# Patient Record
Sex: Male | Born: 1960 | Race: White | Hispanic: No | Marital: Single | Smoking: Never smoker
Health system: Southern US, Community
[De-identification: ages and names within clinical notes are randomized; demographics above are authoritative.]

## PROBLEM LIST (undated history)

## (undated) VITALS — BP 106/51 | HR 72 | Temp 97.8°F | Resp 18 | Ht 65.0 in | Wt 340.0 lb

## (undated) DIAGNOSIS — F319 Bipolar disorder, unspecified: Secondary | ICD-10-CM

## (undated) DIAGNOSIS — M1990 Unspecified osteoarthritis, unspecified site: Secondary | ICD-10-CM

## (undated) DIAGNOSIS — F32A Depression, unspecified: Secondary | ICD-10-CM

## (undated) DIAGNOSIS — J449 Chronic obstructive pulmonary disease, unspecified: Secondary | ICD-10-CM

## (undated) DIAGNOSIS — R569 Unspecified convulsions: Secondary | ICD-10-CM

## (undated) DIAGNOSIS — F209 Schizophrenia, unspecified: Secondary | ICD-10-CM

## (undated) DIAGNOSIS — G4730 Sleep apnea, unspecified: Secondary | ICD-10-CM

## (undated) DIAGNOSIS — F329 Major depressive disorder, single episode, unspecified: Secondary | ICD-10-CM

---

## 2015-08-03 ENCOUNTER — Encounter (HOSPITAL_COMMUNITY): Payer: Self-pay | Admitting: *Deleted

## 2015-08-03 ENCOUNTER — Emergency Department (HOSPITAL_COMMUNITY)
Admission: EM | Admit: 2015-08-03 | Discharge: 2015-08-04 | Disposition: A | Discharge status: Other Acute Inpatient Hospital | Payer: Medicare Other | Attending: Emergency Medicine | Admitting: Emergency Medicine

## 2015-08-03 ENCOUNTER — Encounter (HOSPITAL_COMMUNITY): Payer: Self-pay | Admitting: Emergency Medicine

## 2015-08-03 DIAGNOSIS — F319 Bipolar disorder, unspecified: Secondary | ICD-10-CM | POA: Diagnosis present

## 2015-08-03 DIAGNOSIS — Z7951 Long term (current) use of inhaled steroids: Secondary | ICD-10-CM | POA: Insufficient documentation

## 2015-08-03 DIAGNOSIS — F209 Schizophrenia, unspecified: Secondary | ICD-10-CM | POA: Diagnosis not present

## 2015-08-03 DIAGNOSIS — J449 Chronic obstructive pulmonary disease, unspecified: Secondary | ICD-10-CM | POA: Diagnosis present

## 2015-08-03 DIAGNOSIS — G4700 Insomnia, unspecified: Secondary | ICD-10-CM | POA: Diagnosis present

## 2015-08-03 DIAGNOSIS — F22 Delusional disorders: Secondary | ICD-10-CM | POA: Diagnosis not present

## 2015-08-03 DIAGNOSIS — Z7289 Other problems related to lifestyle: Secondary | ICD-10-CM

## 2015-08-03 DIAGNOSIS — F1110 Opioid abuse, uncomplicated: Secondary | ICD-10-CM | POA: Insufficient documentation

## 2015-08-03 DIAGNOSIS — Z7982 Long term (current) use of aspirin: Secondary | ICD-10-CM | POA: Insufficient documentation

## 2015-08-03 DIAGNOSIS — I10 Essential (primary) hypertension: Secondary | ICD-10-CM | POA: Diagnosis present

## 2015-08-03 DIAGNOSIS — F329 Major depressive disorder, single episode, unspecified: Secondary | ICD-10-CM | POA: Diagnosis present

## 2015-08-03 DIAGNOSIS — Z993 Dependence on wheelchair: Secondary | ICD-10-CM

## 2015-08-03 DIAGNOSIS — M810 Age-related osteoporosis without current pathological fracture: Secondary | ICD-10-CM | POA: Diagnosis present

## 2015-08-03 DIAGNOSIS — M1990 Unspecified osteoarthritis, unspecified site: Secondary | ICD-10-CM | POA: Insufficient documentation

## 2015-08-03 DIAGNOSIS — Z880 Allergy status to penicillin: Secondary | ICD-10-CM | POA: Diagnosis not present

## 2015-08-03 DIAGNOSIS — Z79899 Other long term (current) drug therapy: Secondary | ICD-10-CM | POA: Diagnosis not present

## 2015-08-03 DIAGNOSIS — G40909 Epilepsy, unspecified, not intractable, without status epilepticus: Secondary | ICD-10-CM | POA: Insufficient documentation

## 2015-08-03 DIAGNOSIS — Z833 Family history of diabetes mellitus: Secondary | ICD-10-CM

## 2015-08-03 DIAGNOSIS — F200 Paranoid schizophrenia: Secondary | ICD-10-CM | POA: Diagnosis not present

## 2015-08-03 DIAGNOSIS — R0789 Other chest pain: Secondary | ICD-10-CM | POA: Diagnosis not present

## 2015-08-03 DIAGNOSIS — Z6841 Body Mass Index (BMI) 40.0 and over, adult: Secondary | ICD-10-CM

## 2015-08-03 DIAGNOSIS — E221 Hyperprolactinemia: Secondary | ICD-10-CM | POA: Diagnosis present

## 2015-08-03 DIAGNOSIS — I252 Old myocardial infarction: Secondary | ICD-10-CM

## 2015-08-03 LAB — ETHANOL: Alcohol, Ethyl (B): <5 mg/dL (ref <5)

## 2015-08-03 LAB — COMPREHENSIVE METABOLIC PANEL
ALBUMIN: 3.8 g/dL (ref 3.5–5.0)
ALK PHOS: 88 U/L (ref 38–126)
ALT: 14 U/L — AB (ref 17–63)
AST: 19 U/L (ref 15–41)
Anion gap: 8 (ref 5–15)
BILIRUBIN TOTAL: 0.6 mg/dL (ref 0.3–1.2)
BUN: 15 mg/dL (ref 6–20)
CALCIUM: 8.5 mg/dL — AB (ref 8.9–10.3)
CO2: 19 mmol/L — AB (ref 22–32)
Chloride: 112 mmol/L — ABNORMAL HIGH (ref 101–111)
Creatinine, Ser: 0.87 mg/dL (ref 0.61–1.24)
GFR calc Af Amer: >60 mL/min (ref >60)
GFR calc non Af Amer: >60 mL/min (ref >60)
GLUCOSE: 98 mg/dL (ref 65–99)
Potassium: 3.9 mmol/L (ref 3.5–5.1)
SODIUM: 139 mmol/L (ref 135–145)
TOTAL PROTEIN: 7.1 g/dL (ref 6.5–8.1)

## 2015-08-03 LAB — CBC WITH DIFFERENTIAL/PLATELET
BASOS PCT: 0 % (ref 0–1)
Basophils Absolute: 0 10*3/uL (ref 0.0–0.1)
EOS ABS: 0.1 10*3/uL (ref 0.0–0.7)
Eosinophils Relative: 1 % (ref 0–5)
HEMATOCRIT: 42.5 % (ref 39.0–52.0)
HEMOGLOBIN: 13.6 g/dL (ref 13.0–17.0)
LYMPHS ABS: 2 10*3/uL (ref 0.7–4.0)
Lymphocytes Relative: 20 % (ref 12–46)
MCH: 30.2 pg (ref 26.0–34.0)
MCHC: 32 g/dL (ref 30.0–36.0)
MCV: 94.4 fL (ref 78.0–100.0)
MONO ABS: 0.6 10*3/uL (ref 0.1–1.0)
MONOS PCT: 6 % (ref 3–12)
Neutro Abs: 7.3 10*3/uL (ref 1.7–7.7)
Neutrophils Relative %: 73 % (ref 43–77)
Platelets: 237 10*3/uL (ref 150–400)
RBC: 4.5 MIL/uL (ref 4.22–5.81)
RDW: 13.8 % (ref 11.5–15.5)
WBC: 10 10*3/uL (ref 4.0–10.5)

## 2015-08-03 LAB — RAPID URINE DRUG SCREEN, HOSP PERFORMED
Amphetamines: NOT DETECTED
BARBITURATES: NOT DETECTED
Benzodiazepines: NOT DETECTED
COCAINE: NOT DETECTED
OPIATES: POSITIVE — AB
TETRAHYDROCANNABINOL: NOT DETECTED

## 2015-08-03 LAB — SALICYLATE LEVEL

## 2015-08-03 LAB — ACETAMINOPHEN LEVEL: Acetaminophen (Tylenol), Serum: <10 ug/mL — ABNORMAL LOW (ref 10–30)

## 2015-08-03 MED ORDER — ACETAMINOPHEN 325 MG PO TABS
650.0000 mg | ORAL_TABLET | ORAL | Status: DC | PRN
Start: 2015-08-03 — End: 2015-08-04

## 2015-08-03 MED ORDER — ZOLPIDEM TARTRATE 5 MG PO TABS
5.0000 mg | ORAL_TABLET | Freq: Every evening | ORAL | Status: DC | PRN
Start: 2015-08-03 — End: 2015-08-04

## 2015-08-03 MED ORDER — LORAZEPAM 1 MG PO TABS: 1.0000 mg | ORAL_TABLET | Freq: Three times a day (TID) | ORAL | Status: DC | PRN

## 2015-08-03 MED ORDER — ONDANSETRON HCL 4 MG PO TABS: 4.0000 mg | ORAL_TABLET | Freq: Three times a day (TID) | ORAL | Status: DC | PRN

## 2015-08-03 MED ORDER — ALUM & MAG HYDROXIDE-SIMETH 200-200-20 MG/5ML PO SUSP: 30.0000 mL | ORAL | Status: DC | PRN

## 2015-08-03 NOTE — ED Notes (Addendum)
Pt. In burgundy scrubs. Pt. And belongings searched and wanded by security. Pt. Has 1 belongings bag. Pt. Black shorts, black t-shirt and gray snickers, and black walker(at bedside). Pt. Belongings locked up in TCU near the psych-ed Locker # [id number].

## 2015-08-03 NOTE — ED Notes (Signed)
Bed: WA29 Expected date:  Expected time:  Means of arrival:  Comments: Hold for housekeeping mega cleaning

## 2015-08-03 NOTE — ED Notes (Addendum)
Pt. From [facility] came in with complaint depression and for med clearance, alert and oriented x3, denies SI, pt. Claimed of chronic back pain at 8/10. Pt. Reported that he has  been scratching / digging the back of his hands to inflict pain to himself but not to kill himself, scratches and scrapes present on both hands, dorsal.  denies thoughts of hurting others.

## 2015-08-03 NOTE — ED Provider Notes (Signed)
CSN: [id number]     Arrival date & time 08/03/15  1703 History   First MD Initiated Contact with Patient 08/03/15 1956     Chief Complaint  Patient presents with  . Depression  . Medical Clearance      HPI Pt. From [facility] came in with complaint depression and for med clearance, alert and oriented x3, denies SI, pt. Claimed of chronic back pain at 8/10. Pt. Reported that he has been scratching / digging the back of his hands to inflict pain to himself but not to kill himself, scratches and scrapes present on both hands, dorsal. denies thoughts of hurting others. Past Medical History  Diagnosis Date  . Sleep apnea   . Schizophrenia   . Bipolar disorder   . Depression   . COPD (chronic obstructive pulmonary disease)   . Seizures   . Arthritis    History reviewed. No pertinent past surgical history. History reviewed. No pertinent family history. Social History  Substance Use Topics  . Smoking status: Never Smoker   . Smokeless tobacco: None  . Alcohol Use: No    Review of Systems    Allergies  A & d; Bee venom; Codeine; Penicillins; Shellfish allergy; and Tetracyclines & related  Home Medications   Prior to Admission medications   Medication Sig Start Date End Date Taking? Authorizing Provider  albuterol (PROVENTIL HFA;VENTOLIN HFA) 108 (90 BASE) MCG/ACT inhaler Inhale 1 puff into the lungs every 4 (four) hours as needed for wheezing or shortness of breath (and coughing).   Yes Historical Provider, MD  amLODipine (NORVASC) 5 MG tablet Take 5 mg by mouth daily with breakfast.   Yes Historical Provider, MD  aspirin EC 81 MG tablet Take 81 mg by mouth daily with breakfast.   Yes Historical Provider, MD  balsalazide (COLAZAL) 750 MG capsule Take 2,250 mg by mouth 2 (two) times daily.   Yes Historical Provider, MD  benzonatate (TESSALON) 100 MG capsule Take 100 mg by mouth 3 (three) times daily as needed for cough.   Yes Historical Provider, MD  calcium acetate (PHOSLO) 667 MG  capsule Take 1,334 mg by mouth daily with breakfast.   Yes Historical Provider, MD  carbamazepine (TEGRETOL) 200 MG tablet Take 400 mg by mouth at bedtime.   Yes Historical Provider, MD  colestipol (COLESTID) 1 G tablet Take 1 g by mouth 2 (two) times daily as needed (for diarrhea).   Yes Historical Provider, MD  cyclobenzaprine (FLEXERIL) 10 MG tablet Take 10 mg by mouth 3 (three) times daily as needed (for pain).   Yes Historical Provider, MD  fluocinonide cream (LIDEX) 0.05 % Apply 1 application topically 2 (two) times daily as needed (for rash).   Yes Historical Provider, MD  FLUoxetine (PROZAC) 40 MG capsule Take 40 mg by mouth 2 (two) times daily.   Yes Historical Provider, MD  fluticasone (FLONASE) 50 MCG/ACT nasal spray Place 1 spray into both nostrils daily.   Yes Historical Provider, MD  fluticasone (FLOVENT HFA) 110 MCG/ACT inhaler Inhale 2 puffs into the lungs 2 (two) times daily.   Yes Historical Provider, MD  gabapentin (NEURONTIN) 300 MG capsule Take 300-600 mg by mouth 3 (three) times daily. Takes  at 0800 and 1600, then  at 2100   Yes Historical Provider, MD  HYDROcodone-acetaminophen (NORCO/VICODIN) 5-325 MG per tablet Take 1 tablet by mouth 3 (three) times daily as needed for moderate pain.   Yes Historical Provider, MD  ibuprofen (ADVIL,MOTRIN) 800 MG tablet Take 800 mg  by mouth every 8 (eight) hours as needed (for pain).   Yes Historical Provider, MD  ipratropium-albuterol (DUONEB) 0.5-2.5 (3) MG/3ML SOLN Take 3 mLs by nebulization 4 (four) times daily as needed (for shortness of breath).   Yes Historical Provider, MD  lisinopril (PRINIVIL,ZESTRIL) 10 MG tablet Take 5 mg by mouth daily with breakfast.   Yes Historical Provider, MD  Lurasidone HCl (LATUDA) 120 MG TABS Take 120 mg by mouth at bedtime.   Yes Historical Provider, MD  montelukast (SINGULAIR) 10 MG tablet Take 10 mg by mouth daily with breakfast.   Yes Historical Provider, MD  omeprazole (PRILOSEC) 20 MG  capsule Take 20 mg by mouth daily with breakfast.   Yes Historical Provider, MD  oxyCODONE-acetaminophen (PERCOCET/ROXICET) 5-325 MG per tablet Take 1 tablet by mouth every 6 (six) hours as needed (for pain).   Yes Historical Provider, MD  predniSONE (DELTASONE) 1 MG tablet Take 4-8 mg by mouth See admin instructions. Day 1-Takes 2 tablets at breakfast and bedtime, then 1 tablet for lunch and dinner Day 2-Takes 1 tablet at breakfast, lunch, dinner, and 2 tablets at bedtime Day 3-Takes 1 tablet breakfast, lunch, dinner, and bedtime  Day 4-Takes 1 tablet breakfast, lunch, and bedtime Day 5-Takes 1 tablet at breakfast and bedtime Day 6-Takes 1 tablet with breakfast 08/03/15  Yes Historical Provider, MD  promethazine (PHENERGAN) 25 MG tablet Take 25 mg by mouth every 4 (four) hours as needed for nausea.   Yes Historical Provider, MD  Propylene Glycol (SYSTANE BALANCE) 0.6 % SOLN Place 1 drop into both eyes 4 (four) times daily.   Yes Historical Provider, MD  psyllium (REGULOID) 0.52 G capsule Take 0.52 g by mouth daily with breakfast.   Yes Historical Provider, MD  senna-docusate (SENEXON-S) 8.6-50 MG per tablet Take 2 tablets by mouth at bedtime as needed for mild constipation.   Yes Historical Provider, MD  tamsulosin (FLOMAX) 0.4 MG CAPS capsule Take 0.4 mg by mouth at bedtime.   Yes Historical Provider, MD  topiramate (TOPAMAX) 100 MG tablet Take 100 mg by mouth 3 (three) times daily.   Yes Historical Provider, MD  traMADol (ULTRAM) 50 MG tablet Take 50 mg by mouth every 6 (six) hours as needed (for pain).   Yes Historical Provider, MD  Vitamin D, Ergocalciferol, (DRISDOL) 50000 UNITS CAPS capsule Take 50,000 Units by mouth every Sunday.   Yes Historical Provider, MD   BP 118/64 mmHg  Pulse 66  Temp(Src) 98.6 F (37 C) (Oral)  Resp 18  SpO2 96% Physical Exam  Constitutional: He is oriented to person, place, and time. He appears well-developed and well-nourished. No distress.  HENT:  Head:  Normocephalic and atraumatic.  Eyes: Pupils are equal, round, and reactive to light.  Neck: Normal range of motion.  Cardiovascular: Normal rate and intact distal pulses.   Pulmonary/Chest: No respiratory distress.  Abdominal: Normal appearance. He exhibits no distension. There is no tenderness. There is no rebound.  Musculoskeletal: Normal range of motion.  Neurological: He is alert and oriented to person, place, and time. No cranial nerve deficit.  Skin: Skin is warm and dry.  Psychiatric: His affect is blunt. He is actively hallucinating. Thought content is delusional. He expresses suicidal ideation.  Nursing note and vitals reviewed.   ED Course  Procedures (including critical care time) Labs Review Labs Reviewed  COMPREHENSIVE METABOLIC PANEL - Abnormal; Notable for the following:    Chloride 112 (*)    CO2 19 (*)    Calcium  8.5 (*)    ALT 14 (*)    All other components within normal limits  URINE RAPID DRUG SCREEN, HOSP PERFORMED - Abnormal; Notable for the following:    Opiates POSITIVE (*)    All other components within normal limits  ACETAMINOPHEN LEVEL - Abnormal; Notable for the following:    Acetaminophen (Tylenol), Serum <10 (*)    All other components within normal limits  ETHANOL  CBC WITH DIFFERENTIAL/PLATELET  SALICYLATE LEVEL    Imaging Review No results found. I have personally reviewed and evaluated these images and lab results as part of my medical decision-making.   He of schizophrenia.  States actively hallucinating. Appears medically stable. Will admit to psych for evaluation and placement MDM   Final diagnoses:  None        Nelva Nay, MD 08/04/15 1339

## 2015-08-03 NOTE — BH Assessment (Signed)
Assessment Note  Edwin Henderson is an 54 y.o. male that presents to [facility] via EMS.  Pt stated his ACT Team, [facility], called EMS on his behalf.  Pt stated he has been harming himself and hearing voices telling him to do so.  Pt stated that he hears "demonic voices" telling him to "scratch and dig" at his arms.  Pt stated that this has been going on for over a year, but has recently worsened.  Pt stated, "I need help."  Pt has self-inflicted wounds on his chest, arms, and legs, some covered with bandages.  No delusions noted.  Pt denies having visual hallucinations.  Pt denies HI or SA.  Pt endorses sx of depression.  Pt stated he has a hx of diagnoses of Schizophrenia and Bipolar Disorder.  Pt stated he got "sick" when his father passed in 2009.  He stated he has been hospitalized twice in 2009 and 2015 at [facility] for psychosis.  Pt stated he cannot recall his mental health or medical medications.  Pt stated he lives in a group home, [facility].  Pt stated he voluntarily went there after living with his cousin when his father died because the voices were telling him to kill his cousin and she was scared of him.  Pt stated that is when he found out he had Schizophrenia.  Pt is on disability.  Pt stated current stressors are not having family and getting into arguments with peers at the group home.  Pt has outpatient med management and ACT services through [facility].  Pt pleasant, cooperative, disheveled, obese, oriented x 4, had good eye contact, logical/coherent thought processes, and normal speech.  Inpatient psychiatric hospitalization is recommended for stabilization of sx at this time.  Consulted with Serena Colonel, NP who recommends inpatient treatment.  Pt accepted to [facility] pending medical clearance.  Pt sent to [facility] for med clearance via [place], as pt is voluntary.  Pt has a walker.  Called and updated [facility] charge nurse and TTS.  Axis I: 295.90 Schizophrenia Axis II: Deferred Axis III:  Past Medical  History  Diagnosis Date  . Sleep apnea   . Schizophrenia   . Bipolar disorder   . Depression   . COPD (chronic obstructive pulmonary disease)   . Seizures   . Arthritis    Axis IV: economic problems, other psychosocial or environmental problems, problems related to social environment and problems with primary support group Axis V: 21-30 behavior considerably influenced by delusions or hallucinations OR serious impairment in judgment, communication OR inability to function in almost all areas  Past Medical History:  Past Medical History  Diagnosis Date  . Sleep apnea   . Schizophrenia   . Bipolar disorder   . Depression   . COPD (chronic obstructive pulmonary disease)   . Seizures   . Arthritis     No past surgical history on file.  Family History: No family history on file.  Social History:  reports that he does not drink alcohol or use illicit drugs. His tobacco history is not on file.  Additional Social History:  Alcohol / Drug Use Pain Medications: see med list Prescriptions: see med list Over the Counter: see med list History of alcohol / drug use?: No history of alcohol / drug abuse Longest period of sobriety (when/how long):  (na) Negative Consequences of Use:  (na) Withdrawal Symptoms:  (na)  CIWA:   COWS:    Allergies:  Allergies  Allergen Reactions  . Bee Venom   .  Codeine   . Penicillins   . Shellfish Allergy     Home Medications:  (Not in a hospital admission)  OB/GYN Status:  No LMP for male patient.  General Assessment Data Location of Assessment: [facility] Assessment Services TTS Assessment: In system Is this a Tele or Face-to-Face Assessment?: Face-to-Face Is this an Initial Assessment or a Re-assessment for this encounter?: Initial Assessment Marital status: Single Maiden name:  (na) Is patient pregnant?:  (na) Pregnancy Status:  (na) Living Arrangements: Group Home Can pt return to current living arrangement?: Yes Admission Status:  Voluntary Is patient capable of signing voluntary admission?: Yes Referral Source: Other (EMS) Insurance type: MCD and MCR  Medical Screening Exam [facility] Walk-in ONLY) Medical Exam completed: No Reason for MSE not completed: Other: (pt sent to [facility] for medical clearance)  Crisis Care Plan Living Arrangements: Group Home Name of Psychiatrist: [facility] Name of Therapist: none  Education Status Is patient currently in school?: No Current Grade: na Highest grade of school patient has completed: 66 Name of school: na Contact person: na  Risk to self with the past 6 months Suicidal Ideation: No Has patient been a risk to self within the past 6 months prior to admission? : No Suicidal Intent: No Has patient had any suicidal intent within the past 6 months prior to admission? : No Is patient at risk for suicide?: No Suicidal Plan?: No Has patient had any suicidal plan within the past 6 months prior to admission? : No Access to Means: No What has been your use of drugs/alcohol within the last 12 months?: na-pt denies Previous Attempts/Gestures: No How many times?: 0 Other Self Harm Risks: na-pt denies Triggers for Past Attempts: None known Intentional Self Injurious Behavior: Damaging Comment - Self Injurious Behavior: picking skin until bleeds Family Suicide History: No Recent stressful life event(s): Conflict (Comment), Other (Comment) (Hallucinations, some conflict with peers, self-harm) Persecutory voices/beliefs?: Yes Depression: Yes Depression Symptoms: Despondent, Insomnia, Feeling worthless/self pity, Feeling angry/irritable Substance abuse history and/or treatment for substance abuse?: No Suicide prevention information given to non-admitted patients: Not applicable  Risk to Others within the past 6 months Homicidal Ideation: No Does patient have any lifetime risk of violence toward others beyond the six months prior to admission? : No Thoughts of Harm to Others: No Current  Homicidal Intent: No Current Homicidal Plan: No Access to Homicidal Means: No Identified Victim: na-pt denies History of harm to others?: No Assessment of Violence: None Noted Violent Behavior Description: na-pt cooperative Does patient have access to weapons?: No Criminal Charges Pending?: No Does patient have a court date: No Is patient on probation?: No  Psychosis Hallucinations: Auditory, With command (Hears "demonic voices" telling him to harm himself) Delusions: None noted  Mental Status Report Appearance/Hygiene: Disheveled Eye Contact: Good Motor Activity: Freedom of movement, Unremarkable Speech: Logical/coherent Level of Consciousness: Alert Mood: Depressed, Sad Affect: Depressed, Sad Anxiety Level: Moderate Thought Processes: Coherent, Relevant Judgement: Partial Orientation: Person, Place, Time, Situation Obsessive Compulsive Thoughts/Behaviors: None  Cognitive Functioning Concentration: Decreased Memory: Recent Impaired, Remote Impaired IQ: Average Insight: Fair Impulse Control: Poor Appetite: Poor Weight Loss: 0 Weight Gain:  (unknown) Sleep: Decreased Total Hours of Sleep:  (varies, wakes through night) Vegetative Symptoms: None  ADLScreening [facility] Assessment Services) Patient's cognitive ability adequate to safely complete daily activities?: Yes Patient able to express need for assistance with ADLs?: Yes Independently performs ADLs?: Yes (appropriate for developmental age)  Prior Inpatient Therapy Prior Inpatient Therapy: Yes Prior Therapy Dates: [facility] Prior Therapy  Facilty/Provider(s): 2009, 2015 Reason for Treatment: psychosis  Prior Outpatient Therapy Prior Outpatient Therapy: Yes Prior Therapy Dates: Current Prior Therapy Facilty/Provider(s): [facility], Dr. Sandria Manly in 2012 Reason for Treatment: Me dmgnt Does patient have an ACCT team?: Yes Does patient have Intensive In-House Services?  : Unknown Does patient have [facility] services? : No Does  patient have P4CC services?: Unknown  ADL Screening (condition at time of admission) Patient's cognitive ability adequate to safely complete daily activities?: Yes Is the patient deaf or have difficulty hearing?: No Does the patient have difficulty seeing, even when wearing glasses/contacts?: No Does the patient have difficulty concentrating, remembering, or making decisions?: No Patient able to express need for assistance with ADLs?: Yes Does the patient have difficulty dressing or bathing?: No Independently performs ADLs?: Yes (appropriate for developmental age) Does the patient have difficulty walking or climbing stairs?: Yes  Home Assistive Devices/Equipment Home Assistive Devices/Equipment: Environmental consultant (specify type), Nebulizer    Abuse/Neglect Assessment (Assessment to be complete while patient is alone) Physical Abuse: Denies Verbal Abuse: Denies Sexual Abuse: Denies Exploitation of patient/patient's resources: Denies Self-Neglect: Denies Values / Beliefs Cultural Requests During Hospitalization: None Spiritual Requests During Hospitalization: None Consults Spiritual Care Consult Needed: No Social Work Consult Needed: No Merchant navy officer (For Healthcare) Does patient have an advance directive?: No Would patient like information on creating an advanced directive?: No - patient declined information    Additional Information 1:1 In Past 12 Months?: No CIRT Risk: No Elopement Risk: No Does patient have medical clearance?: No     Disposition:  Disposition Initial Assessment Completed for this Encounter: Yes Disposition of Patient: Inpatient treatment program Type of inpatient treatment program: Adult (Pt accepted [facility] pending medical clearance)  On Site Evaluation by:   Reviewed with Physician:    Casimer Lanius, MS, [facility] Therapeutic Triage Specialist [facility]   08/03/2015 4:21 PM

## 2015-08-04 ENCOUNTER — Encounter (HOSPITAL_COMMUNITY): Payer: Self-pay

## 2015-08-04 ENCOUNTER — Inpatient Hospital Stay (HOSPITAL_COMMUNITY)
Admission: AD | Admit: 2015-08-04 | Discharge: 2015-08-13 | DRG: 885 | Disposition: A | Discharge status: Home / Self Care | Payer: Medicare Other | Attending: Emergency Medicine | Admitting: Emergency Medicine

## 2015-08-04 DIAGNOSIS — E221 Hyperprolactinemia: Secondary | ICD-10-CM | POA: Diagnosis present

## 2015-08-04 DIAGNOSIS — M1990 Unspecified osteoarthritis, unspecified site: Secondary | ICD-10-CM | POA: Diagnosis present

## 2015-08-04 DIAGNOSIS — Z993 Dependence on wheelchair: Secondary | ICD-10-CM | POA: Diagnosis not present

## 2015-08-04 DIAGNOSIS — F209 Schizophrenia, unspecified: Secondary | ICD-10-CM | POA: Diagnosis present

## 2015-08-04 DIAGNOSIS — R079 Chest pain, unspecified: Secondary | ICD-10-CM | POA: Diagnosis present

## 2015-08-04 DIAGNOSIS — Z6841 Body Mass Index (BMI) 40.0 and over, adult: Secondary | ICD-10-CM | POA: Diagnosis not present

## 2015-08-04 DIAGNOSIS — F201 Disorganized schizophrenia: Secondary | ICD-10-CM | POA: Insufficient documentation

## 2015-08-04 DIAGNOSIS — F200 Paranoid schizophrenia: Principal | ICD-10-CM

## 2015-08-04 DIAGNOSIS — Z7289 Other problems related to lifestyle: Secondary | ICD-10-CM | POA: Insufficient documentation

## 2015-08-04 DIAGNOSIS — J449 Chronic obstructive pulmonary disease, unspecified: Secondary | ICD-10-CM | POA: Diagnosis present

## 2015-08-04 DIAGNOSIS — G4700 Insomnia, unspecified: Secondary | ICD-10-CM | POA: Diagnosis present

## 2015-08-04 DIAGNOSIS — F489 Nonpsychotic mental disorder, unspecified: Secondary | ICD-10-CM | POA: Diagnosis not present

## 2015-08-04 DIAGNOSIS — I10 Essential (primary) hypertension: Secondary | ICD-10-CM | POA: Diagnosis present

## 2015-08-04 DIAGNOSIS — R0789 Other chest pain: Secondary | ICD-10-CM | POA: Diagnosis not present

## 2015-08-04 DIAGNOSIS — R45851 Suicidal ideations: Secondary | ICD-10-CM | POA: Diagnosis not present

## 2015-08-04 DIAGNOSIS — F319 Bipolar disorder, unspecified: Secondary | ICD-10-CM | POA: Diagnosis present

## 2015-08-04 DIAGNOSIS — Z833 Family history of diabetes mellitus: Secondary | ICD-10-CM | POA: Diagnosis not present

## 2015-08-04 DIAGNOSIS — I252 Old myocardial infarction: Secondary | ICD-10-CM | POA: Diagnosis not present

## 2015-08-04 DIAGNOSIS — F203 Undifferentiated schizophrenia: Secondary | ICD-10-CM | POA: Diagnosis not present

## 2015-08-04 DIAGNOSIS — M810 Age-related osteoporosis without current pathological fracture: Secondary | ICD-10-CM | POA: Diagnosis present

## 2015-08-04 HISTORY — DX: Bipolar disorder, unspecified: F31.9

## 2015-08-04 HISTORY — DX: Depression, unspecified: F32.A

## 2015-08-04 HISTORY — DX: Unspecified osteoarthritis, unspecified site: M19.90

## 2015-08-04 HISTORY — DX: Chronic obstructive pulmonary disease, unspecified: J44.9

## 2015-08-04 HISTORY — DX: Sleep apnea, unspecified: G47.30

## 2015-08-04 HISTORY — DX: Unspecified convulsions: R56.9

## 2015-08-04 HISTORY — DX: Schizophrenia, unspecified: F20.9

## 2015-08-04 HISTORY — DX: Major depressive disorder, single episode, unspecified: F32.9

## 2015-08-04 MED ORDER — BALSALAZIDE DISODIUM 750 MG PO CAPS
2250.0000 mg | ORAL_CAPSULE | Freq: Two times a day (BID) | ORAL | Status: DC
  Administered 2015-08-04 – 2015-08-13 (×19): 2250 mg via ORAL
  Filled 2015-08-04 (×25): qty 3

## 2015-08-04 MED ORDER — ALUM & MAG HYDROXIDE-SIMETH 200-200-20 MG/5ML PO SUSP: 30.0000 mL | ORAL | Status: DC | PRN

## 2015-08-04 MED ORDER — PANTOPRAZOLE SODIUM 40 MG PO TBEC
40.0000 mg | DELAYED_RELEASE_TABLET | Freq: Every day | ORAL | Status: DC
  Administered 2015-08-04 – 2015-08-10 (×7): 40 mg via ORAL
  Filled 2015-08-04 (×10): qty 1

## 2015-08-04 MED ORDER — GABAPENTIN 300 MG PO CAPS
300.0000 mg | ORAL_CAPSULE | ORAL | Status: DC
Start: 2015-08-04 — End: 2015-08-13
  Administered 2015-08-04 – 2015-08-13 (×19): 300 mg via ORAL
  Filled 2015-08-04 (×25): qty 1

## 2015-08-04 MED ORDER — AMLODIPINE BESYLATE 5 MG PO TABS
5.0000 mg | ORAL_TABLET | Freq: Every day | ORAL | Status: DC
  Administered 2015-08-04 – 2015-08-13 (×10): 5 mg via ORAL
  Filled 2015-08-04 (×14): qty 1

## 2015-08-04 MED ORDER — LURASIDONE HCL 40 MG PO TABS
120.0000 mg | ORAL_TABLET | Freq: Every day | ORAL | Status: DC
  Administered 2015-08-04 – 2015-08-05 (×2): 120 mg via ORAL
  Filled 2015-08-04: qty 3
  Filled 2015-08-04: qty 1
  Filled 2015-08-04 (×2): qty 3

## 2015-08-04 MED ORDER — TOPIRAMATE 100 MG PO TABS
100.0000 mg | ORAL_TABLET | Freq: Three times a day (TID) | ORAL | Status: DC
  Administered 2015-08-04 – 2015-08-13 (×29): 100 mg via ORAL
  Filled 2015-08-04 (×38): qty 1

## 2015-08-04 MED ORDER — VITAMIN D (ERGOCALCIFEROL) 1.25 MG (50000 UNIT) PO CAPS
50000.0000 [IU] | ORAL_CAPSULE | ORAL | Status: DC
  Administered 2015-08-05 – 2015-08-12 (×2): 50000 [IU] via ORAL
  Filled 2015-08-04 (×2): qty 1

## 2015-08-04 MED ORDER — PSYLLIUM 95 % PO PACK
1.0000 | PACK | Freq: Every day | ORAL | Status: DC
  Administered 2015-08-04 – 2015-08-13 (×10): 1 via ORAL
  Filled 2015-08-04 (×13): qty 1

## 2015-08-04 MED ORDER — ALBUTEROL SULFATE HFA 108 (90 BASE) MCG/ACT IN AERS: 1.0000 | INHALATION_SPRAY | RESPIRATORY_TRACT | Status: DC | PRN

## 2015-08-04 MED ORDER — FLUTICASONE PROPIONATE 50 MCG/ACT NA SUSP
1.0000 | Freq: Every day | NASAL | Status: DC
  Administered 2015-08-04 – 2015-08-13 (×10): 1 via NASAL
  Filled 2015-08-04: qty 16

## 2015-08-04 MED ORDER — MONTELUKAST SODIUM 10 MG PO TABS
10.0000 mg | ORAL_TABLET | Freq: Every day | ORAL | Status: DC
  Administered 2015-08-04 – 2015-08-13 (×10): 10 mg via ORAL
  Filled 2015-08-04 (×13): qty 1

## 2015-08-04 MED ORDER — POLYVINYL ALCOHOL 1.4 % OP SOLN
1.0000 [drp] | Freq: Four times a day (QID) | OPHTHALMIC | Status: DC
  Administered 2015-08-04 – 2015-08-13 (×36): 1 [drp] via OPHTHALMIC
  Filled 2015-08-04: qty 15

## 2015-08-04 MED ORDER — FLUOXETINE HCL 20 MG PO CAPS
40.0000 mg | ORAL_CAPSULE | Freq: Two times a day (BID) | ORAL | Status: DC
  Administered 2015-08-04 – 2015-08-07 (×7): 40 mg via ORAL
  Filled 2015-08-04 (×12): qty 2

## 2015-08-04 MED ORDER — PROPYLENE GLYCOL 0.6 % OP SOLN: 1.0000 [drp] | Freq: Four times a day (QID) | OPHTHALMIC | Status: DC

## 2015-08-04 MED ORDER — CALCIUM ACETATE (PHOS BINDER) 667 MG PO CAPS
1334.0000 mg | ORAL_CAPSULE | Freq: Every day | ORAL | Status: DC
Start: 2015-08-04 — End: 2015-08-13
  Administered 2015-08-04 – 2015-08-13 (×10): 1334 mg via ORAL
  Filled 2015-08-04 (×13): qty 2

## 2015-08-04 MED ORDER — LISINOPRIL 5 MG PO TABS
5.0000 mg | ORAL_TABLET | Freq: Every day | ORAL | Status: DC
  Administered 2015-08-04 – 2015-08-13 (×10): 5 mg via ORAL
  Filled 2015-08-04 (×14): qty 1

## 2015-08-04 MED ORDER — CARBAMAZEPINE 200 MG PO TABS
400.0000 mg | ORAL_TABLET | Freq: Every day | ORAL | Status: DC
  Administered 2015-08-04 – 2015-08-12 (×9): 400 mg via ORAL
  Filled 2015-08-04 (×12): qty 2

## 2015-08-04 MED ORDER — PSYLLIUM 0.52 G PO CAPS: 0.5200 g | ORAL_CAPSULE | Freq: Every day | ORAL | Status: DC

## 2015-08-04 MED ORDER — GABAPENTIN 300 MG PO CAPS
600.0000 mg | ORAL_CAPSULE | Freq: Every day | ORAL | Status: DC
  Administered 2015-08-04 – 2015-08-12 (×9): 600 mg via ORAL
  Filled 2015-08-04 (×12): qty 2

## 2015-08-04 MED ORDER — TAMSULOSIN HCL 0.4 MG PO CAPS
0.4000 mg | ORAL_CAPSULE | Freq: Every day | ORAL | Status: DC
  Administered 2015-08-04 – 2015-08-12 (×9): 0.4 mg via ORAL
  Filled 2015-08-04 (×12): qty 1

## 2015-08-04 MED ORDER — FLUTICASONE PROPIONATE HFA 110 MCG/ACT IN AERO
2.0000 | INHALATION_SPRAY | Freq: Two times a day (BID) | RESPIRATORY_TRACT | Status: DC
  Administered 2015-08-04 – 2015-08-13 (×19): 2 via RESPIRATORY_TRACT
  Filled 2015-08-04: qty 12

## 2015-08-04 MED ORDER — GABAPENTIN 300 MG PO CAPS: 300.0000 mg | ORAL_CAPSULE | Freq: Three times a day (TID) | ORAL | Status: DC

## 2015-08-04 MED ORDER — MAGNESIUM HYDROXIDE 400 MG/5ML PO SUSP: 30.0000 mL | Freq: Every day | ORAL | Status: DC | PRN

## 2015-08-04 MED ORDER — ACETAMINOPHEN 325 MG PO TABS
650.0000 mg | ORAL_TABLET | Freq: Four times a day (QID) | ORAL | Status: DC | PRN
  Administered 2015-08-04 – 2015-08-11 (×5): 650 mg via ORAL
  Filled 2015-08-04 (×5): qty 2

## 2015-08-04 NOTE — Tx Team (Signed)
Initial Interdisciplinary Treatment Plan   PATIENT STRESSORS: Health problems   PATIENT STRENGTHS: Active sense of humor Average or above average intelligence Motivation for treatment/growth Supportive family/friends   PROBLEM LIST: Problem List/Patient Goals Date to be addressed Date deferred Reason deferred Estimated date of resolution  depression 08/04/2015     Auditory hallucination 08/04/2015     "I want to get of the voice" 08/04/2015     Risk of suicide 08/04/2015                                    DISCHARGE CRITERIA:  Improved stabilization in mood, thinking, and/or behavior Motivation to continue treatment in a less acute level of care  PRELIMINARY DISCHARGE PLAN: Outpatient therapy Placement in alternative living arrangements Return to previous living arrangement  PATIENT/FAMIILY INVOLVEMENT: This treatment plan has been presented to and reviewed with the patient, Edwin Henderson,  The patient and family have been given the opportunity to ask questions and make suggestions.  JEHU-APPIAH, Leighanna Kirn K 08/04/2015, 6:36 AM

## 2015-08-04 NOTE — ED Notes (Signed)
Pt.'s Personal belongings including pt.'s walker endorsed to transport [place].

## 2015-08-04 NOTE — Progress Notes (Signed)
D) Pt stated that he had voided on himself this morning. Pt was up in the bathroom, naked and Pt's bed was soaked with urine. Pt has numerous cuts on his arms which he says "the devil tells me to dig holes in my skin".  Pt is limited. A) Pt taken to the bathroom and was given a bath in the large tub. Needing assistance to get to several parts of his body that he is not able to reach. R) Pt given a urinal and is presently back in his bed resting. Has asked when lunch is several times.

## 2015-08-04 NOTE — H&P (Signed)
Psychiatric Admission Assessment Adult  Patient Identification: Edwin Henderson MRN:  [id number] Date of Evaluation:  08/05/2015 Chief Complaint:  "The voices tell me to scratch myself."  Principal Diagnosis: Schizophrenia Diagnosis:   Patient Active Problem List   Diagnosis Date Noted  . Schizophrenia [F20.9] 08/04/2015  . Self-injurious behavior [F48.9]    History of Present Illness::  Edwin Henderson is an 54 y.o. male that presents to [facility] via EMS. Pt stated his ACT Team, [facility], called EMS on his behalf. Pt stated he has been harming himself and hearing voices telling him to do so. Pt stated that he hears "demonic voices" telling him to "scratch and dig" at his arms. Pt stated that this has been going on for over a year, but has recently worsened. Pt stated, "I need help." Pt has self-inflicted wounds on his chest, arms, and legs, some covered with bandages. No delusions noted. Pt denies having visual hallucinations. Pt denies HI or SA. Pt endorses sx of depression. Pt stated he has a hx of diagnoses of Schizophrenia and Bipolar Disorder. Pt stated he got "sick" when his father passed in 2009. He stated he has been hospitalized twice in 2009 and 2015 at [facility] for psychosis. Pt stated he cannot recall his mental health or medical medications. Pt stated he lives in a group home, [facility]. Pt stated he voluntarily went there after living with his cousin when his father died because the voices were telling him to kill his cousin and she was scared of him. Pt stated that is when he found out he had Schizophrenia. Pt is on disability. Pt stated current stressors are not having family and getting into arguments with peers at the group home. Pt has outpatient med management and ACT services through [facility]. Pt pleasant, cooperative, disheveled, obese, oriented x 4, had good eye contact, logical/coherent thought processes, and normal speech.   Elements:  Location:   psychosis. Quality:  Auditory hallucinations with command. Severity:  Severe. Timing:  Last few weeks. Duration:  Chronic. Context:  Feels medications not working, has chronic mental illness. Associated Signs/Symptoms: Depression Symptoms:  depressed mood, anhedonia, psychomotor retardation, feelings of worthlessness/guilt, difficulty concentrating, hopelessness, recurrent thoughts of death, anxiety, loss of energy/fatigue, (Hypo) Manic Symptoms:  Denies Anxiety Symptoms:  Excessive Worry, Psychotic Symptoms:  Hallucinations: Auditory PTSD Symptoms: Negative Total Time spent with patient: 1 hour  Past Medical History:  Past Medical History  Diagnosis Date  . Sleep apnea   . Schizophrenia   . Bipolar disorder   . Depression   . COPD (chronic obstructive pulmonary disease)   . Seizures   . Arthritis    History reviewed. No pertinent past surgical history. Family History: History reviewed. No pertinent family history. Social History:  History  Alcohol Use No     History  Drug Use No    Social History   Social History  . Marital Status: Single    Spouse Name: N/A  . Number of Children: N/A  . Years of Education: N/A   Social History Main Topics  . Smoking status: Never Smoker   . Smokeless tobacco: None  . Alcohol Use: No  . Drug Use: No  . Sexual Activity: Not Asked   Other Topics Concern  . None   Social History Narrative   Additional Social History:    Pain Medications: see med list Prescriptions: see med list Over the Counter: see med list History of alcohol / drug use?: No history of alcohol / drug  abuse Longest period of sobriety (when/how long):  (na) Negative Consequences of Use:  (na) Withdrawal Symptoms:  (na)                     Musculoskeletal: Strength & Muscle Tone: decreased Gait & Station: In wheelchair due to chronic leg pain Patient leans: N/A  Psychiatric Specialty Exam: Physical Exam  Review of Systems   Constitutional: Negative.   HENT: Negative.   Eyes: Negative.   Respiratory: Negative.   Cardiovascular: Negative.   Gastrointestinal: Negative.   Genitourinary: Negative.   Musculoskeletal: Positive for joint pain.  Skin: Negative.   Neurological: Negative.   Endo/Heme/Allergies: Negative.   Psychiatric/Behavioral: Positive for depression, suicidal ideas and hallucinations. The patient is nervous/anxious and has insomnia.     Blood pressure 123/74, pulse 64, temperature 98.3 F (36.8 C), temperature source Oral, resp. rate 16, height 5' 5"  (1.651 m), weight 154.223 kg (340 lb).Body mass index is 56.58 kg/(m^2).  General Appearance: Bizarre, Disheveled and Guarded  Eye Contact::  Good  Speech:  Clear and Coherent and Slow  Volume:  Decreased  Mood:  Depressed and Worthless  Affect:  Constricted  Thought Process:  Coherent and Goal Directed  Orientation:  Full (Time, Place, and Person)  Thought Content:  Delusions, Hallucinations: Command: telling him hurt himselfr, Paranoid Ideation and Rumination  Suicidal Thoughts:  Yes.  without intent/plan  Homicidal Thoughts:  No  Memory:  Immediate;   Good Recent;   Good Remote;   Good  Judgement:  Impaired  Insight:  Shallow  Psychomotor Activity:  Decreased  Concentration:  Fair  Recall:  [place] of Knowledge:Fair  Language: Good  Akathisia:  Negative  Handed:  Right  AIMS (if indicated):     Assets:  Communication Skills Desire for Improvement Housing Leisure Time Resilience Transportation  ADL's:  Intact  Cognition: WNL  Sleep:  Number of Hours: 6.5   Risk to Self: Suicidal Ideation: No Suicidal Intent: No Is patient at risk for suicide?: No Suicidal Plan?: No Access to Means: No What has been your use of drugs/alcohol within the last 12 months?: na-pt denies How many times?: 0 Other Self Harm Risks: na-pt denies Triggers for Past Attempts: None known Intentional Self Injurious Behavior: Damaging Comment -  Self Injurious Behavior: picking skin until bleeds Risk to Others: Homicidal Ideation: No Thoughts of Harm to Others: No Current Homicidal Intent: No Current Homicidal Plan: No Access to Homicidal Means: No Identified Victim: na-pt denies History of harm to others?: No Assessment of Violence: None Noted Violent Behavior Description: na-pt cooperative Does patient have access to weapons?: No Criminal Charges Pending?: No Does patient have a court date: No Prior Inpatient Therapy: Prior Inpatient Therapy: Yes Prior Therapy Dates: [facility] Prior Therapy Facilty/Provider(s): 2009, 2015 Reason for Treatment: psychosis Prior Outpatient Therapy: Prior Outpatient Therapy: Yes Prior Therapy Dates: Current Prior Therapy Facilty/Provider(s): [facility], Dr. Erling Cruz in 2012 Reason for Treatment: Me dmgnt Does patient have an ACCT team?: Yes Does patient have Intensive In-House Services?  : Unknown Does patient have [facility] services? : No Does patient have P4[facility] services?: Unknown  Alcohol Screening: 1. How often do you have a drink containing alcohol?: Never 9. Have you or someone else been injured as a result of your drinking?: No 10. Has a relative or friend or a doctor or another health worker been concerned about your drinking or suggested you cut down?: No Alcohol Use Disorder Identification Test Final Score (AUDIT): 0 Brief Intervention: AUDIT  score less than 7 or less-screening does not suggest unhealthy drinking-brief intervention not indicated  Allergies:   Allergies  Allergen Reactions  . A & D [Dimethicone-Zinc Oxide-Vit A-D]     Unknown reaction per MAR   . Bee Venom     Unknown reaction per MAR   . Codeine     Unknown reaction per MAR   . Penicillins     Unknown reaction per MAR   . Shellfish Allergy     Unknown reaction per MAR   . Tetracyclines & Related     Unknown reaction per [facility]    Lab Results:  Results for orders placed or performed during the hospital encounter of 08/03/15  (from the past 48 hour(s))  Comprehensive metabolic panel     Status: Abnormal   Collection Time: 08/03/15  8:08 PM  Result Value Ref Range   Sodium 139 135 - 145 mmol/L   Potassium 3.9 3.5 - 5.1 mmol/L   Chloride 112 (H) 101 - 111 mmol/L   CO2 19 (L) 22 - 32 mmol/L   Glucose, Bld 98 65 - 99 mg/dL   BUN 15 6 - 20 mg/dL   Creatinine, Ser 0.87 0.61 - 1.24 mg/dL   Calcium 8.5 (L) 8.9 - 10.3 mg/dL   Total Protein 7.1 6.5 - 8.1 g/dL   Albumin 3.8 3.5 - 5.0 g/dL   AST 19 15 - 41 U/L   ALT 14 (L) 17 - 63 U/L   Alkaline Phosphatase 88 38 - 126 U/L   Total Bilirubin 0.6 0.3 - 1.2 mg/dL   GFR calc non Af Amer >60 >60 mL/min   GFR calc Af Amer >60 >60 mL/min    Comment: (NOTE) The eGFR has been calculated using the CKD EPI equation. This calculation has not been validated in all clinical situations. eGFR's persistently <60 mL/min signify possible Chronic Kidney Disease.    Anion gap 8 5 - 15  CBC with Diff     Status: None   Collection Time: 08/03/15  8:08 PM  Result Value Ref Range   WBC 10.0 4.0 - 10.5 K/uL   RBC 4.50 4.22 - 5.81 MIL/uL   Hemoglobin 13.6 13.0 - 17.0 g/dL   HCT 42.5 39.0 - 52.0 %   MCV 94.4 78.0 - 100.0 fL   MCH 30.2 26.0 - 34.0 pg   MCHC 32.0 30.0 - 36.0 g/dL   RDW 13.8 11.5 - 15.5 %   Platelets 237 150 - 400 K/uL   Neutrophils Relative % 73 43 - 77 %   Neutro Abs 7.3 1.7 - 7.7 K/uL   Lymphocytes Relative 20 12 - 46 %   Lymphs Abs 2.0 0.7 - 4.0 K/uL   Monocytes Relative 6 3 - 12 %   Monocytes Absolute 0.6 0.1 - 1.0 K/uL   Eosinophils Relative 1 0 - 5 %   Eosinophils Absolute 0.1 0.0 - 0.7 K/uL   Basophils Relative 0 0 - 1 %   Basophils Absolute 0.0 0.0 - 0.1 K/uL  Ethanol     Status: None   Collection Time: 08/03/15  8:09 PM  Result Value Ref Range   Alcohol, Ethyl (B) <5 <5 mg/dL    Comment:        LOWEST DETECTABLE LIMIT FOR SERUM ALCOHOL IS 5 mg/dL FOR MEDICAL PURPOSES ONLY   Acetaminophen level     Status: Abnormal   Collection Time: 08/03/15   8:09 PM  Result Value Ref Range   Acetaminophen (Tylenol), Serum <  10 (L) 10 - 30 ug/mL    Comment:        THERAPEUTIC CONCENTRATIONS VARY SIGNIFICANTLY. A RANGE OF 10-30 ug/mL MAY BE AN EFFECTIVE CONCENTRATION FOR MANY PATIENTS. HOWEVER, SOME ARE BEST TREATED AT CONCENTRATIONS OUTSIDE THIS RANGE. ACETAMINOPHEN CONCENTRATIONS >150 ug/mL AT 4 HOURS AFTER INGESTION AND >50 ug/mL AT 12 HOURS AFTER INGESTION ARE OFTEN ASSOCIATED WITH TOXIC REACTIONS.   Salicylate level     Status: None   Collection Time: 08/03/15  8:09 PM  Result Value Ref Range   Salicylate Lvl <5.3 2.8 - 30.0 mg/dL  Urine rapid drug screen (hosp performed)not at [facility]     Status: Abnormal   Collection Time: 08/03/15  9:18 PM  Result Value Ref Range   Opiates POSITIVE (A) NONE DETECTED   Cocaine NONE DETECTED NONE DETECTED   Benzodiazepines NONE DETECTED NONE DETECTED   Amphetamines NONE DETECTED NONE DETECTED   Tetrahydrocannabinol NONE DETECTED NONE DETECTED   Barbiturates NONE DETECTED NONE DETECTED    Comment:        DRUG SCREEN FOR MEDICAL PURPOSES ONLY.  IF CONFIRMATION IS NEEDED FOR ANY PURPOSE, NOTIFY LAB WITHIN 5 DAYS.        LOWEST DETECTABLE LIMITS FOR URINE DRUG SCREEN Drug Class       Cutoff (ng/mL) Amphetamine      1000 Barbiturate      200 Benzodiazepine   299 Tricyclics       242 Opiates          300 Cocaine          300 THC              50    Current Medications: Current Facility-Administered Medications  Medication Dose Route Frequency Provider Last Rate Last Dose  . acetaminophen (TYLENOL) tablet 650 mg  650 mg Oral Q6H PRN Harriet Butte, NP   650 mg at 08/04/15 1949  . albuterol (PROVENTIL HFA;VENTOLIN HFA) 108 (90 BASE) MCG/ACT inhaler 1 puff  1 puff Inhalation Q4H PRN Harriet Butte, NP      . alum & mag hydroxide-simeth (MAALOX/MYLANTA) 200-200-20 MG/5ML suspension 30 mL  30 mL Oral Q4H PRN Harriet Butte, NP      . amLODipine (NORVASC) tablet 5 mg  5 mg Oral Q breakfast  Harriet Butte, NP   5 mg at 08/04/15 1000  . balsalazide (COLAZAL) capsule 2,250 mg  2,250 mg Oral BID Harriet Butte, NP   2,250 mg at 08/04/15 1716  . calcium acetate (PHOSLO) capsule 1,334 mg  1,334 mg Oral Q breakfast Harriet Butte, NP   1,334 mg at 08/04/15 0959  . carbamazepine (TEGRETOL) tablet 400 mg  400 mg Oral QHS Harriet Butte, NP   400 mg at 08/04/15 2128  . FLUoxetine (PROZAC) capsule 40 mg  40 mg Oral BID Harriet Butte, NP   40 mg at 08/04/15 1717  . fluticasone (FLONASE) 50 MCG/ACT nasal spray 1 spray  1 spray Each Nare Daily Harriet Butte, NP   1 spray at 08/04/15 0958  . fluticasone (FLOVENT HFA) 110 MCG/ACT inhaler 2 puff  2 puff Inhalation BID Harriet Butte, NP   2 puff at 08/04/15 1949  . gabapentin (NEURONTIN) capsule 300 mg  300 mg Oral 2 times per day Harriet Butte, NP   300 mg at 08/04/15 1716  . gabapentin (NEURONTIN) capsule 600 mg  600 mg Oral QHS Harriet Butte, NP   600 mg at  08/04/15 2128  . lisinopril (PRINIVIL,ZESTRIL) tablet 5 mg  5 mg Oral Q breakfast Harriet Butte, NP   5 mg at 08/04/15 1000  . lurasidone (LATUDA) tablet 120 mg  120 mg Oral QHS Harriet Butte, NP   120 mg at 08/04/15 2128  . magnesium hydroxide (MILK OF MAGNESIA) suspension 30 mL  30 mL Oral Daily PRN Harriet Butte, NP      . montelukast (SINGULAIR) tablet 10 mg  10 mg Oral Q breakfast Harriet Butte, NP   10 mg at 08/04/15 0959  . pantoprazole (PROTONIX) EC tablet 40 mg  40 mg Oral Daily Harriet Butte, NP   40 mg at 08/04/15 0959  . polyvinyl alcohol (LIQUIFILM TEARS) 1.4 % ophthalmic solution 1 drop  1 drop Both Eyes QID Harriet Butte, NP   1 drop at 08/04/15 1949  . psyllium (HYDROCIL/METAMUCIL) packet 1 packet  1 packet Oral Q breakfast Harriet Butte, NP   1 packet at 08/04/15 1204  . tamsulosin (FLOMAX) capsule 0.4 mg  0.4 mg Oral QHS Harriet Butte, NP   0.4 mg at 08/04/15 2128  . topiramate (TOPAMAX) tablet 100 mg  100 mg Oral TID Harriet Butte, NP   100 mg at  08/04/15 1716  . Vitamin D (Ergocalciferol) (DRISDOL) capsule 50,000 Units  50,000 Units Oral Q Sun Harriet Butte, NP       PTA Medications: Prescriptions prior to admission  Medication Sig Dispense Refill Last Dose  . albuterol (PROVENTIL HFA;VENTOLIN HFA) 108 (90 BASE) MCG/ACT inhaler Inhale 1 puff into the lungs every 4 (four) hours as needed for wheezing or shortness of breath (and coughing).   unknown  . amLODipine (NORVASC) 5 MG tablet Take 5 mg by mouth daily with breakfast.   08/03/2015 at 0800  . aspirin EC 81 MG tablet Take 81 mg by mouth daily with breakfast.   08/03/2015 at 0800  . balsalazide (COLAZAL) 750 MG capsule Take 2,250 mg by mouth 2 (two) times daily.   08/03/2015 at 0800  . benzonatate (TESSALON) 100 MG capsule Take 100 mg by mouth 3 (three) times daily as needed for cough.   unknown  . calcium acetate (PHOSLO) 667 MG capsule Take 1,334 mg by mouth daily with breakfast.   08/03/2015 at 0800  . carbamazepine (TEGRETOL) 200 MG tablet Take 400 mg by mouth at bedtime.   08/02/2015 at 2100  . colestipol (COLESTID) 1 G tablet Take 1 g by mouth 2 (two) times daily as needed (for diarrhea).   unknown  . cyclobenzaprine (FLEXERIL) 10 MG tablet Take 10 mg by mouth 3 (three) times daily as needed (for pain).   07/30/2015  . fluocinonide cream (LIDEX) 3.73 % Apply 1 application topically 2 (two) times daily as needed (for rash).   unknown  . FLUoxetine (PROZAC) 40 MG capsule Take 40 mg by mouth 2 (two) times daily.   08/03/2015 at 0800  . fluticasone (FLONASE) 50 MCG/ACT nasal spray Place 1 spray into both nostrils daily.   08/03/2015 at 0800  . fluticasone (FLOVENT HFA) 110 MCG/ACT inhaler Inhale 2 puffs into the lungs 2 (two) times daily.   08/03/2015 at 0800  . gabapentin (NEURONTIN) 300 MG capsule Take 300-600 mg by mouth 3 (three) times daily. Takes 335m at 0800 and 1600, then 6024mat 2100   08/03/2015 at 0800  . HYDROcodone-acetaminophen (NORCO/VICODIN) 5-325 MG per tablet Take 1 tablet by mouth  3 (three) times daily as needed  for moderate pain.   08/02/2015 at Unknown time  . ibuprofen (ADVIL,MOTRIN) 800 MG tablet Take 800 mg by mouth every 8 (eight) hours as needed (for pain).   Past Week at Unknown time  . ipratropium-albuterol (DUONEB) 0.5-2.5 (3) MG/3ML SOLN Take 3 mLs by nebulization 4 (four) times daily as needed (for shortness of breath).   Past Week at Unknown time  . lisinopril (PRINIVIL,ZESTRIL) 10 MG tablet Take 5 mg by mouth daily with breakfast.   08/03/2015 at 0800  . Lurasidone HCl (LATUDA) 120 MG TABS Take 120 mg by mouth at bedtime.   08/02/2015 at 2100  . montelukast (SINGULAIR) 10 MG tablet Take 10 mg by mouth daily with breakfast.   08/03/2015 at 0800  . omeprazole (PRILOSEC) 20 MG capsule Take 20 mg by mouth daily with breakfast.   08/03/2015 at 0800  . oxyCODONE-acetaminophen (PERCOCET/ROXICET) 5-325 MG per tablet Take 1 tablet by mouth every 6 (six) hours as needed (for pain).   unknown  . predniSONE (DELTASONE) 1 MG tablet Take 4-8 mg by mouth See admin instructions. Day 1-Takes 2 tablets at breakfast and bedtime, then 1 tablet for lunch and dinner Day 2-Takes 1 tablet at breakfast, lunch, dinner, and 2 tablets at bedtime Day 3-Takes 1 tablet breakfast, lunch, dinner, and bedtime  Day 4-Takes 1 tablet breakfast, lunch, and bedtime Day 5-Takes 1 tablet at breakfast and bedtime Day 6-Takes 1 tablet with breakfast   08/03/2015 at 0800  . promethazine (PHENERGAN) 25 MG tablet Take 25 mg by mouth every 4 (four) hours as needed for nausea.   unknown  . Propylene Glycol (SYSTANE BALANCE) 0.6 % SOLN Place 1 drop into both eyes 4 (four) times daily.   08/03/2015 at 0800  . psyllium (REGULOID) 0.52 G capsule Take 0.52 g by mouth daily with breakfast.   08/03/2015 at 0800  . senna-docusate (SENEXON-S) 8.6-50 MG per tablet Take 2 tablets by mouth at bedtime as needed for mild constipation.   unknown  . tamsulosin (FLOMAX) 0.4 MG CAPS capsule Take 0.4 mg by mouth at bedtime.   08/02/2015 at 2100   . topiramate (TOPAMAX) 100 MG tablet Take 100 mg by mouth 3 (three) times daily.   08/03/2015 at 0800  . traMADol (ULTRAM) 50 MG tablet Take 50 mg by mouth every 6 (six) hours as needed (for pain).   unknown  . Vitamin D, Ergocalciferol, (DRISDOL) 50000 UNITS CAPS capsule Take 50,000 Units by mouth every Sunday.   Past Month at Unknown time    Previous Psychotropic Medications: Yes  Risperdal, Latuda Substance Abuse History in the last 12 months:  No.  Consequences of Substance Abuse: Negative  Results for orders placed or performed during the hospital encounter of 08/03/15 (from the past 72 hour(s))  Comprehensive metabolic panel     Status: Abnormal   Collection Time: 08/03/15  8:08 PM  Result Value Ref Range   Sodium 139 135 - 145 mmol/L   Potassium 3.9 3.5 - 5.1 mmol/L   Chloride 112 (H) 101 - 111 mmol/L   CO2 19 (L) 22 - 32 mmol/L   Glucose, Bld 98 65 - 99 mg/dL   BUN 15 6 - 20 mg/dL   Creatinine, Ser 0.87 0.61 - 1.24 mg/dL   Calcium 8.5 (L) 8.9 - 10.3 mg/dL   Total Protein 7.1 6.5 - 8.1 g/dL   Albumin 3.8 3.5 - 5.0 g/dL   AST 19 15 - 41 U/L   ALT 14 (L) 17 - 63 U/L  Alkaline Phosphatase 88 38 - 126 U/L   Total Bilirubin 0.6 0.3 - 1.2 mg/dL   GFR calc non Af Amer >60 >60 mL/min   GFR calc Af Amer >60 >60 mL/min    Comment: (NOTE) The eGFR has been calculated using the CKD EPI equation. This calculation has not been validated in all clinical situations. eGFR's persistently <60 mL/min signify possible Chronic Kidney Disease.    Anion gap 8 5 - 15  CBC with Diff     Status: None   Collection Time: 08/03/15  8:08 PM  Result Value Ref Range   WBC 10.0 4.0 - 10.5 K/uL   RBC 4.50 4.22 - 5.81 MIL/uL   Hemoglobin 13.6 13.0 - 17.0 g/dL   HCT 42.5 39.0 - 52.0 %   MCV 94.4 78.0 - 100.0 fL   MCH 30.2 26.0 - 34.0 pg   MCHC 32.0 30.0 - 36.0 g/dL   RDW 13.8 11.5 - 15.5 %   Platelets 237 150 - 400 K/uL   Neutrophils Relative % 73 43 - 77 %   Neutro Abs 7.3 1.7 - 7.7 K/uL    Lymphocytes Relative 20 12 - 46 %   Lymphs Abs 2.0 0.7 - 4.0 K/uL   Monocytes Relative 6 3 - 12 %   Monocytes Absolute 0.6 0.1 - 1.0 K/uL   Eosinophils Relative 1 0 - 5 %   Eosinophils Absolute 0.1 0.0 - 0.7 K/uL   Basophils Relative 0 0 - 1 %   Basophils Absolute 0.0 0.0 - 0.1 K/uL  Ethanol     Status: None   Collection Time: 08/03/15  8:09 PM  Result Value Ref Range   Alcohol, Ethyl (B) <5 <5 mg/dL    Comment:        LOWEST DETECTABLE LIMIT FOR SERUM ALCOHOL IS 5 mg/dL FOR MEDICAL PURPOSES ONLY   Acetaminophen level     Status: Abnormal   Collection Time: 08/03/15  8:09 PM  Result Value Ref Range   Acetaminophen (Tylenol), Serum <10 (L) 10 - 30 ug/mL    Comment:        THERAPEUTIC CONCENTRATIONS VARY SIGNIFICANTLY. A RANGE OF 10-30 ug/mL MAY BE AN EFFECTIVE CONCENTRATION FOR MANY PATIENTS. HOWEVER, SOME ARE BEST TREATED AT CONCENTRATIONS OUTSIDE THIS RANGE. ACETAMINOPHEN CONCENTRATIONS >150 ug/mL AT 4 HOURS AFTER INGESTION AND >50 ug/mL AT 12 HOURS AFTER INGESTION ARE OFTEN ASSOCIATED WITH TOXIC REACTIONS.   Salicylate level     Status: None   Collection Time: 08/03/15  8:09 PM  Result Value Ref Range   Salicylate Lvl <2.0 2.8 - 30.0 mg/dL  Urine rapid drug screen (hosp performed)not at [facility]     Status: Abnormal   Collection Time: 08/03/15  9:18 PM  Result Value Ref Range   Opiates POSITIVE (A) NONE DETECTED   Cocaine NONE DETECTED NONE DETECTED   Benzodiazepines NONE DETECTED NONE DETECTED   Amphetamines NONE DETECTED NONE DETECTED   Tetrahydrocannabinol NONE DETECTED NONE DETECTED   Barbiturates NONE DETECTED NONE DETECTED    Comment:        DRUG SCREEN FOR MEDICAL PURPOSES ONLY.  IF CONFIRMATION IS NEEDED FOR ANY PURPOSE, NOTIFY LAB WITHIN 5 DAYS.        LOWEST DETECTABLE LIMITS FOR URINE DRUG SCREEN Drug Class       Cutoff (ng/mL) Amphetamine      1000 Barbiturate      200 Benzodiazepine   233 Tricyclics       435 Opiates  300 Cocaine           300 THC              50     Observation Level/Precautions:  15 minute checks  Laboratory:  CBC Chemistry Profile UDS  Psychotherapy:  Individual and Group Therapy  Medications:  Continue Latuda 120 mg daily for psychosis, Continue Prozac 40 mg daily for depression, consider changing antipsychotic agent if patient continues to experience severe auditory hallucinations  Consultations:  As needed  Discharge Concerns:  Safety and Stability   Estimated LOS: 2-5 days  Other:     Psychological Evaluations: Yes   Treatment Plan Summary: Daily contact with patient to assess and evaluate symptoms and progress in treatment and Medication management  Medical Decision Making:  Review of Psycho-Social Stressors (1), Review or order clinical lab tests (1), Established Problem, Worsening (2), Review of Medication Regimen & Side Effects (2) and Review of New Medication or Change in Dosage (2)  I certify that inpatient services furnished can reasonably be expected to improve the patient's condition.   Elmarie Shiley, NP-C 9/11/20169:04 AM   Patient seen face-to-face for the psychiatric evaluation, case discussed with the physician extender, formulated treatment plan, completed admission suicide risk assessment and certify that patient need acute psychiatric hospitalization for crisis evaluation, safety monitoring and medication management and therapeutic interventions.Reviewed the information documented and agree with the treatment plan.  Orly Quimby,JANARDHAHA R. 08/05/2015 4:54 PM,

## 2015-08-04 NOTE — [facility] Suicide Risk Assessment (Signed)
[facility] Admission Suicide Risk Assessment   Nursing information obtained from:  Patient, Review of record Demographic factors:  Caucasian, Unemployed Current Mental Status:  Suicidal ideation indicated by patient, Self-harm thoughts, Self-harm behaviors Loss Factors:  Decline in physical health Historical Factors:  Family history of mental illness or substance abuse Risk Reduction Factors:  Positive social support Total Time spent with patient: 30 minutes Principal Problem: Schizophrenia Diagnosis:   Patient Active Problem List   Diagnosis Date Noted  . Schizophrenia [F20.9] 08/04/2015     Continued Clinical Symptoms:  Alcohol Use Disorder Identification Test Final Score (AUDIT): 0 The "Alcohol Use Disorders Identification Test", Guidelines for Use in Primary Care, Second Edition.  World Science writer [facility]). Score between 0-7:  no or low risk or alcohol related problems. Score between 8-15:  moderate risk of alcohol related problems. Score between 16-19:  high risk of alcohol related problems. Score 20 or above:  warrants further diagnostic evaluation for alcohol dependence and treatment.   CLINICAL FACTORS:   Depression:   Anhedonia Delusional Hopelessness Impulsivity Insomnia Recent sense of peace/wellbeing Severe Schizophrenia:   Command hallucinatons Paranoid or undifferentiated type Previous Psychiatric Diagnoses and Treatments   Musculoskeletal: Strength & Muscle Tone: decreased Gait & Station: seen him wheel chair Patient leans: N/A  Psychiatric Specialty Exam: Physical Exam  ROS  Blood pressure 111/74, pulse 85, temperature 98.7 F (37.1 C), temperature source Oral, resp. rate 18, height  (1.651 m), weight 154.223 kg (340 lb).Body mass index is 56.58 kg/(m^2).  General Appearance: Bizarre, Disheveled and Guarded  Eye Contact::  Good  Speech:  Blocked, Clear and Coherent and Slow  Volume:  Decreased  Mood:  Anxious, Depressed and Worthless  Affect:   Constricted and Depressed  Thought Process:  Coherent and Goal Directed  Orientation:  Full (Time, Place, and Person)  Thought Content:  Delusions, Hallucinations: Command:  telling him hurt himselfr, Paranoid Ideation and Rumination  Suicidal Thoughts:  No  Homicidal Thoughts:  No  Memory:  Immediate;   Good Recent;   Fair  Judgement:  Impaired  Insight:  Shallow  Psychomotor Activity:  Decreased  Concentration:  Fair  Recall:  [employer] of Knowledge:Fair  Language: Good  Akathisia:  Negative  Handed:  Right  AIMS (if indicated):     Assets:  Communication Skills Desire for Improvement Housing Leisure Time Physical Health Resilience Transportation  Sleep:     Cognition: WNL  ADL's:  Intact     COGNITIVE FEATURES THAT CONTRIBUTE TO RISK:  Closed-mindedness, Loss of executive function, Polarized thinking and Thought constriction (tunnel vision)    SUICIDE RISK:   Moderate:  Frequent suicidal ideation with limited intensity, and duration, some specificity in terms of plans, no associated intent, good self-control, limited dysphoria/symptomatology, some risk factors present, and identifiable protective factors, including available and accessible social support.  PLAN OF CARE: Admit for psychosis with command hallucinations and self injurious behaviors, needs crisis evaluation, safety monitoring and medication adjustments.   Medical Decision Making:  Review of Psycho-Social Stressors (1), Review or order clinical lab tests (1), Established Problem, Worsening (2), Review of Last Therapy Session (1), Review or order medicine tests (1), Review of Medication Regimen & Side Effects (2) and Review of New Medication or Change in Dosage (2)  I certify that inpatient services furnished can reasonably be expected to improve the patient's condition.   Saheed Carrington,JANARDHAHA R. 08/04/2015, 1:13 PM

## 2015-08-04 NOTE — Care Management Utilization Note (Signed)
   Per State Regulation 482.30  This chart was reviewed for necessity with respect to the patient's Admission/ Duration of stay.  Next review date: 08/08/15  Nicolasa Ducking RN, BSN

## 2015-08-04 NOTE — Progress Notes (Signed)
Did not attend group 

## 2015-08-04 NOTE — Progress Notes (Signed)
Patient ID: Edwin Henderson, male   DOB: 01-28-1961, 54 y.o.   MRN: [id number] Admission note: D:Patient is a voluntary admission in no acute distress for depression. Pt reports hearing demonic voices telling him to dig and scratch himself. Pt has scratches on bilateral forearm and belly. Pt reports he has been compliant with medication. Pt reports he lives in a group home.  A: Pt admitted to unit per protocol, skin assessment and belonging search done. Consent signed by pt. Pt educated on therapeutic milieu rules. Pt was introduced to milieu by nursing staff. Fall risk safety plan explained to the patient. 15 minutes checks started for safety.  R: Pt was receptive to education. Writer offered support.

## 2015-08-04 NOTE — [facility] Group Notes (Signed)
[facility] Group Notes: (Clinical Social Work)   08/04/2015      Type of Therapy:  Group Therapy   Participation Level:  Did Not Attend despite MHT prompting - was tired   Ambrose Mantle, LCSW 08/04/2015, 12:02 PM

## 2015-08-04 NOTE — Progress Notes (Signed)
Patient ID: Edwin Henderson, male   DOB: 03-06-1961, 54 y.o.   MRN: [id number] Received report form Legrand Como. Client is in bed eyes closed, awakens to verbalize stimuli, reports pain right hip "10" of 10. Tylenol administered for pain (see MAR) Staff will maintain safety q79min for safety. Client is safe on unit.

## 2015-08-04 NOTE — Progress Notes (Signed)
Writer spoke with Edwin Henderson:Henderson this evening and he reports hearing demonic voices telling him to scratch at his arms. He has been in his bed this evening and with encouragement to come to the dayroom he reports that he is really sleepy. He has used the urinal once this evening and did not want to attempt to go to the bathroom instead. Writer informed him that staff will check with him about every 2 hours when awake and over in the night to see if needing to void. He is pleasant but depressed and isolative to his room. He is complaint with his medications and snacks provided after taking his medications. Safety maintained on unit with 15 min checks.

## 2015-08-04 NOTE — BHH Counselor (Signed)
After review of chart, Psychosocial Assessment will not be attempted today due to patient's level of psychosis at this time.  Ambrose Mantle, LCSW 08/04/2015, 1:10 PM

## 2015-08-05 MED ORDER — METHOCARBAMOL 750 MG PO TABS
750.0000 mg | ORAL_TABLET | Freq: Three times a day (TID) | ORAL | Status: AC
  Administered 2015-08-05 – 2015-08-12 (×21): 750 mg via ORAL
  Filled 2015-08-05 (×26): qty 1

## 2015-08-05 MED ORDER — NYSTATIN 100000 UNIT/GM EX CREA
TOPICAL_CREAM | Freq: Two times a day (BID) | CUTANEOUS | Status: DC
  Administered 2015-08-05 – 2015-08-11 (×10): via TOPICAL
  Administered 2015-08-12 (×2): 1 via TOPICAL
  Administered 2015-08-13: 09:00:00 via TOPICAL
  Filled 2015-08-05: qty 15

## 2015-08-05 MED ORDER — ONDANSETRON 4 MG PO TBDP
4.0000 mg | ORAL_TABLET | Freq: Three times a day (TID) | ORAL | Status: DC | PRN
  Administered 2015-08-10: 4 mg via ORAL
  Filled 2015-08-05 (×2): qty 1

## 2015-08-05 NOTE — Progress Notes (Signed)
[facility] MD Progress Note  08/05/2015 3:32 PM Gurpreet Mikhail  MRN:  [id number]   Subjective:  Patient is a 54 years old white male with history of schizophrenia admitted for increased symptoms of psychosis including auditory hallucinations, chronic pain and multiple medical problems. Patient appeared lying on his bed and sleeping without distress during my visit.  Objective: Valgene Deloatch is an 54 y.o. male suffering with paranoid schizophrenia and self-injurious behaviors and also following up with his ACT Team, psychotherapeutic services who called EMS on his behalf. Pt stated he has been harming himself and hearing voices telling him to do so. Pt stated that he hears "demonic voices" telling him to "scratch and dig" at his arms.Pt has self-inflicted wounds on his chest, arms, and legs, some covered with bandages.Pt denies HI or SA. Pt stated he has a hx of diagnoses of Schizophrenia and Bipolar Disorder. Pt stated he got "sick" when his father passed away in 08-Feb-2008. He stated he has been hospitalized twice in 02-08-2008 and Feb 07, 2014 at [facility] for psychosis.Pt stated he lives in a group home, [facility]. Pt stated he voluntarily went there after living with his cousin when his father died because the voices were telling him to kill his cousin and she was scared of him.Pt stated current stressors are not having family and getting into arguments with peers at the group home.   Principal Problem: Schizophrenia Diagnosis:   Patient Active Problem List   Diagnosis Date Noted  . Schizophrenia [F20.9] 08/04/2015  . Self-injurious behavior [F48.9]    Total Time spent with patient: 30 minutes   Past Medical History:  Past Medical History  Diagnosis Date  . Sleep apnea   . Schizophrenia   . Bipolar disorder   . Depression   . COPD (chronic obstructive pulmonary disease)   . Seizures   . Arthritis    History reviewed. No pertinent past surgical history. Family History: History reviewed.  No pertinent family history. Social History:  History  Alcohol Use No     History  Drug Use No    Social History   Social History  . Marital Status: Single    Spouse Name: N/A  . Number of Children: N/A  . Years of Education: N/A   Social History Main Topics  . Smoking status: Never Smoker   . Smokeless tobacco: None  . Alcohol Use: No  . Drug Use: No  . Sexual Activity: Not Asked   Other Topics Concern  . None   Social History Narrative   Additional History:    Sleep: Good  Appetite:  Fair   Assessment: Patient presented with psychotic symptoms, paranoid delusions, auditory hallucinations and self-injurious behaviors and unable to respond to outpatient medication management including ACT team  Musculoskeletal: Strength & Muscle Tone: decreased Gait & Station: unable to stand Patient leans: N/A   Psychiatric Specialty Exam: Physical Exam  ROS  Blood pressure 123/74, pulse 64, temperature 98.3 F (36.8 C), temperature source Oral, resp. rate 16, height _0  (1.651 m), weight 154.223 kg (340 lb).Body mass index is 56.58 kg/(m^2).  General Appearance: Bizarre, Disheveled, Guarded and Poor hygiene  Eye Contact::  Fair  Speech:  Clear and Coherent and Slow  Volume:  Decreased  Mood:  Depressed  Affect:  Constricted and Depressed  Thought Process:  Disorganized  Orientation:  Full (Time, Place, and Person)  Thought Content:  Delusions, Hallucinations: Auditory, Paranoid Ideation and Rumination  Suicidal Thoughts:  Yes.  with intent/plan  Homicidal  Thoughts:  No  Memory:  Immediate;   Fair Recent;   Poor  Judgement:  Impaired  Insight:  Shallow  Psychomotor Activity:  Psychomotor Retardation  Concentration:  Fair  Recall:  [place]: Fair  Akathisia:  Negative  Handed:  Right  AIMS (if indicated):     Assets:  Communication Skills Desire for Improvement Financial Resources/Insurance Housing Leisure Time Resilience   ADL's:  Intact  Cognition: Impaired,  Mild  Sleep:  Number of Hours: 6.5     Current Medications: Current Facility-Administered Medications  Medication Dose Route Frequency Provider Last Rate Last Dose  . acetaminophen (TYLENOL) tablet 650 mg  650 mg Oral Q6H PRN Harriet Butte, NP   650 mg at 08/04/15 1949  . albuterol (PROVENTIL HFA;VENTOLIN HFA) 108 (90 BASE) MCG/ACT inhaler 1 puff  1 puff Inhalation Q4H PRN Harriet Butte, NP      . alum & mag hydroxide-simeth (MAALOX/MYLANTA) 200-200-20 MG/5ML suspension 30 mL  30 mL Oral Q4H PRN Harriet Butte, NP      . amLODipine (NORVASC) tablet 5 mg  5 mg Oral Q breakfast Harriet Butte, NP   5 mg at 08/05/15 1148  . balsalazide (COLAZAL) capsule 2,250 mg  2,250 mg Oral BID Harriet Butte, NP   2,250 mg at 08/05/15 1147  . calcium acetate (PHOSLO) capsule 1,334 mg  1,334 mg Oral Q breakfast Harriet Butte, NP   1,334 mg at 08/05/15 1147  . carbamazepine (TEGRETOL) tablet 400 mg  400 mg Oral QHS Harriet Butte, NP   400 mg at 08/04/15 2128  . FLUoxetine (PROZAC) capsule 40 mg  40 mg Oral BID Harriet Butte, NP   40 mg at 08/05/15 1146  . fluticasone (FLONASE) 50 MCG/ACT nasal spray 1 spray  1 spray Each Nare Daily Harriet Butte, NP   1 spray at 08/05/15 1152  . fluticasone (FLOVENT HFA) 110 MCG/ACT inhaler 2 puff  2 puff Inhalation BID Harriet Butte, NP   2 puff at 08/05/15 1151  . gabapentin (NEURONTIN) capsule 300 mg  300 mg Oral 2 times per day Harriet Butte, NP   300 mg at 08/05/15 1529  . gabapentin (NEURONTIN) capsule 600 mg  600 mg Oral QHS Harriet Butte, NP   600 mg at 08/04/15 2128  . lisinopril (PRINIVIL,ZESTRIL) tablet 5 mg  5 mg Oral Q breakfast Harriet Butte, NP   5 mg at 08/05/15 1148  . lurasidone (LATUDA) tablet 120 mg  120 mg Oral QHS Harriet Butte, NP   120 mg at 08/04/15 2128  . magnesium hydroxide (MILK OF MAGNESIA) suspension 30 mL  30 mL Oral Daily PRN Harriet Butte, NP      . montelukast (SINGULAIR)  tablet 10 mg  10 mg Oral Q breakfast Harriet Butte, NP   10 mg at 08/05/15 1148  . nystatin cream (MYCOSTATIN)   Topical BID Paulino Rily, RN      . ondansetron (ZOFRAN-ODT) disintegrating tablet 4 mg  4 mg Oral Q8H PRN Niel Hummer, NP      . pantoprazole (PROTONIX) EC tablet 40 mg  40 mg Oral Daily Harriet Butte, NP   40 mg at 08/05/15 1147  . polyvinyl alcohol (LIQUIFILM TEARS) 1.4 % ophthalmic solution 1 drop  1 drop Both Eyes QID Harriet Butte, NP   1 drop at 08/05/15 1530  . psyllium (HYDROCIL/METAMUCIL) packet 1 packet  1 packet Oral Q breakfast Harriet Butte, NP   1 packet at 08/05/15 1210  . tamsulosin (FLOMAX) capsule 0.4 mg  0.4 mg Oral QHS Harriet Butte, NP   0.4 mg at 08/04/15 2128  . topiramate (TOPAMAX) tablet 100 mg  100 mg Oral TID Harriet Butte, NP   100 mg at 08/05/15 1147  . Vitamin D (Ergocalciferol) (DRISDOL) capsule 50,000 Units  50,000 Units Oral Q Carla Drape, NP   50,000 Units at 08/05/15 1147    Lab Results:  Results for orders placed or performed during the hospital encounter of 08/03/15 (from the past 48 hour(s))  Comprehensive metabolic panel     Status: Abnormal   Collection Time: 08/03/15  8:08 PM  Result Value Ref Range   Sodium 139 135 - 145 mmol/L   Potassium 3.9 3.5 - 5.1 mmol/L   Chloride 112 (H) 101 - 111 mmol/L   CO2 19 (L) 22 - 32 mmol/L   Glucose, Bld 98 65 - 99 mg/dL   BUN 15 6 - 20 mg/dL   Creatinine, Ser 0.87 0.61 - 1.24 mg/dL   Calcium 8.5 (L) 8.9 - 10.3 mg/dL   Total Protein 7.1 6.5 - 8.1 g/dL   Albumin 3.8 3.5 - 5.0 g/dL   AST 19 15 - 41 U/L   ALT 14 (L) 17 - 63 U/L   Alkaline Phosphatase 88 38 - 126 U/L   Total Bilirubin 0.6 0.3 - 1.2 mg/dL   GFR calc non Af Amer >60 >60 mL/min   GFR calc Af Amer >60 >60 mL/min    Comment: (NOTE) The eGFR has been calculated using the CKD EPI equation. This calculation has not been validated in all clinical situations. eGFR's persistently <60 mL/min signify possible Chronic  Kidney Disease.    Anion gap 8 5 - 15  CBC with Diff     Status: None   Collection Time: 08/03/15  8:08 PM  Result Value Ref Range   WBC 10.0 4.0 - 10.5 K/uL   RBC 4.50 4.22 - 5.81 MIL/uL   Hemoglobin 13.6 13.0 - 17.0 g/dL   HCT 42.5 39.0 - 52.0 %   MCV 94.4 78.0 - 100.0 fL   MCH 30.2 26.0 - 34.0 pg   MCHC 32.0 30.0 - 36.0 g/dL   RDW 13.8 11.5 - 15.5 %   Platelets 237 150 - 400 K/uL   Neutrophils Relative % 73 43 - 77 %   Neutro Abs 7.3 1.7 - 7.7 K/uL   Lymphocytes Relative 20 12 - 46 %   Lymphs Abs 2.0 0.7 - 4.0 K/uL   Monocytes Relative 6 3 - 12 %   Monocytes Absolute 0.6 0.1 - 1.0 K/uL   Eosinophils Relative 1 0 - 5 %   Eosinophils Absolute 0.1 0.0 - 0.7 K/uL   Basophils Relative 0 0 - 1 %   Basophils Absolute 0.0 0.0 - 0.1 K/uL  Ethanol     Status: None   Collection Time: 08/03/15  8:09 PM  Result Value Ref Range   Alcohol, Ethyl (B) <5 <5 mg/dL    Comment:        LOWEST DETECTABLE LIMIT FOR SERUM ALCOHOL IS 5 mg/dL FOR MEDICAL PURPOSES ONLY   Acetaminophen level     Status: Abnormal   Collection Time: 08/03/15  8:09 PM  Result Value Ref Range   Acetaminophen (Tylenol), Serum <10 (L) 10 - 30 ug/mL    Comment:  THERAPEUTIC CONCENTRATIONS VARY SIGNIFICANTLY. A RANGE OF 10-30 ug/mL MAY BE AN EFFECTIVE CONCENTRATION FOR MANY PATIENTS. HOWEVER, SOME ARE BEST TREATED AT CONCENTRATIONS OUTSIDE THIS RANGE. ACETAMINOPHEN CONCENTRATIONS >150 ug/mL AT 4 HOURS AFTER INGESTION AND >50 ug/mL AT 12 HOURS AFTER INGESTION ARE OFTEN ASSOCIATED WITH TOXIC REACTIONS.   Salicylate level     Status: None   Collection Time: 08/03/15  8:09 PM  Result Value Ref Range   Salicylate Lvl <8.6 2.8 - 30.0 mg/dL  Urine rapid drug screen (hosp performed)not at [facility]     Status: Abnormal   Collection Time: 08/03/15  9:18 PM  Result Value Ref Range   Opiates POSITIVE (A) NONE DETECTED   Cocaine NONE DETECTED NONE DETECTED   Benzodiazepines NONE DETECTED NONE DETECTED    Amphetamines NONE DETECTED NONE DETECTED   Tetrahydrocannabinol NONE DETECTED NONE DETECTED   Barbiturates NONE DETECTED NONE DETECTED    Comment:        DRUG SCREEN FOR MEDICAL PURPOSES ONLY.  IF CONFIRMATION IS NEEDED FOR ANY PURPOSE, NOTIFY LAB WITHIN 5 DAYS.        LOWEST DETECTABLE LIMITS FOR URINE DRUG SCREEN Drug Class       Cutoff (ng/mL) Amphetamine      1000 Barbiturate      200 Benzodiazepine   381 Tricyclics       771 Opiates          300 Cocaine          300 THC              50     Physical Findings: AIMS: Facial and Oral Movements Muscles of Facial Expression: None, normal Lips and Perioral Area: None, normal Jaw: None, normal Tongue: None, normal,Extremity Movements Upper (arms, wrists, hands, fingers): None, normal Lower (legs, knees, ankles, toes): None, normal, Trunk Movements Neck, shoulders, hips: None, normal, Overall Severity Severity of abnormal movements (highest score from questions above): None, normal Incapacitation due to abnormal movements: None, normal Patient's awareness of abnormal movements (rate only patient's report): No Awareness, Dental Status Current problems with teeth and/or dentures?: No Does patient usually wear dentures?: No  CIWA:    COWS:     Treatment Plan Summary: Daily contact with patient to assess and evaluate symptoms and progress in treatment and Medication management   Treatment Plan/Recommendations:  1. Admit for crisis management and stabilization. 2. Medication management to reduce current symptoms to base line and improve the patient's overall level of functioning. Continue Topamax, lurasisodne, fluoxetine, carbamazepine for schizoaffective disorder, depression and also continue his medication for medical problems Will check carbamazepine level for therapeutic benefits 3. Treat health problems as indicated. 4. Develop treatment plan to decrease risk of relapse upon discharge and to reduce the need for  readmission. 5. Psycho-social education regarding relapse prevention and self care. 6. Health care follow up as needed for medical problems. 7. Restart home medications where appropriate.    Medical Decision Making:  Review of Psycho-Social Stressors (1), Review or order clinical lab tests (1), Established Problem, Worsening (2), Review of Last Therapy Session (1), Review or order medicine tests (1), Review of Medication Regimen & Side Effects (2) and Review of New Medication or Change in Dosage (2)     Dakota Stangl,JANARDHAHA R. 08/05/2015, 3:32 PM

## 2015-08-05 NOTE — Progress Notes (Signed)
D) Pt has been in his bed during the day. Had a student assigned to him this afternoon and got out of bed to talk with her. Continues to state he is suicidal and that he is hearing voices that tell him to tear at his skin. Contracts for his safety on the unit. Pt is pleasant and enjoys interaction on one level and then likes to stay in his bed. Pt has not voided on himself today but did spill his urine on the floor by mistake. Was fearful that staff would yell and belittle him A) Given support, reassurance and praise. Encouragement provided. 1:1 provided. Reassurance given to Pt  that no one would reprimand him for anything.  R) Pt has been out a little more in the milieu today. Has a pleasant attitude.

## 2015-08-05 NOTE — Plan of Care (Signed)
Problem: Diagnosis: Increased Risk For Suicide Attempt Goal: STG-Patient Will Comply With Medication Regime Outcome: Progressing Patient is compliant with scheduled hs medications.      

## 2015-08-05 NOTE — Progress Notes (Signed)
Psychoeducational Group Note  Date:  08/05/2015 Time:  2134  Group Topic/Focus:  Wrap-Up Group:   The focus of this group is to help patients review their daily goal of treatment and discuss progress on daily workbooks.  Participation Level: Did Not Attend  Participation Quality:  Not Applicable  Affect:  Not Applicable  Cognitive:  Not Applicable  Insight:  Not Applicable  Engagement in Group: Not Applicable  Additional Comments:  The patient did not attend group since he was asleep in his bed.  Hazle Coca S 08/05/2015, 9:34 PM

## 2015-08-05 NOTE — [facility] Group Notes (Signed)
[facility] Group Notes: (Clinical Social Work)   08/05/2015      Type of Therapy:  Group Therapy   Participation Level:  Did Not Attend despite MHT prompting   Lorian Yaun Grossman-Orr, LCSW 08/05/2015, 12:14 PM     

## 2015-08-05 NOTE — Plan of Care (Signed)
Problem: Diagnosis: Increased Risk For Suicide Attempt Goal: STG-Patient Will Attend All Groups On The Unit Outcome: Not Progressing Patient did not attend evening group.     
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
## 2015-08-06 ENCOUNTER — Encounter (HOSPITAL_COMMUNITY): Payer: Self-pay | Admitting: Psychiatry

## 2015-08-06 DIAGNOSIS — I10 Essential (primary) hypertension: Secondary | ICD-10-CM | POA: Diagnosis present

## 2015-08-06 LAB — LIPID PANEL
Cholesterol: 194 mg/dL (ref 0–200)
HDL: 44 mg/dL (ref >40)
LDL CALC: 113 mg/dL — AB (ref 0–99)
TRIGLYCERIDES: 183 mg/dL — AB (ref <150)
Total CHOL/HDL Ratio: 4.4 RATIO
VLDL: 37 mg/dL (ref 0–40)

## 2015-08-06 LAB — TSH: TSH: 4.034 u[IU]/mL (ref 0.350–4.500)

## 2015-08-06 LAB — T4, FREE: Free T4: 0.7 ng/dL (ref 0.61–1.12)

## 2015-08-06 LAB — CARBAMAZEPINE LEVEL, TOTAL: Carbamazepine Lvl: 5.5 ug/mL (ref 4.0–12.0)

## 2015-08-06 MED ORDER — DOUBLE ANTIBIOTIC 500-10000 UNIT/GM EX OINT
TOPICAL_OINTMENT | Freq: Two times a day (BID) | CUTANEOUS | Status: DC
  Filled 2015-08-06 (×17): qty 1

## 2015-08-06 MED ORDER — OLANZAPINE 2.5 MG PO TABS
2.5000 mg | ORAL_TABLET | ORAL | Status: DC
  Administered 2015-08-06 – 2015-08-08 (×4): 2.5 mg via ORAL
  Filled 2015-08-06 (×8): qty 1

## 2015-08-06 MED ORDER — DOUBLE ANTIBIOTIC 500-10000 UNIT/GM EX OINT
TOPICAL_OINTMENT | Freq: Two times a day (BID) | CUTANEOUS | Status: DC
  Administered 2015-08-06 – 2015-08-11 (×11): via TOPICAL
  Administered 2015-08-12 (×2): 1 via TOPICAL
  Administered 2015-08-13: 09:00:00 via TOPICAL
  Filled 2015-08-06: qty 14.17

## 2015-08-06 MED ORDER — LURASIDONE HCL 80 MG PO TABS
80.0000 mg | ORAL_TABLET | Freq: Every day | ORAL | Status: DC
  Administered 2015-08-06: 80 mg via ORAL
  Filled 2015-08-06 (×2): qty 1

## 2015-08-06 MED ORDER — AZITHROMYCIN 500 MG PO TABS: 500.0000 mg | ORAL_TABLET | Freq: Every day | ORAL | Status: DC

## 2015-08-06 MED ORDER — LEVOFLOXACIN 500 MG PO TABS
500.0000 mg | ORAL_TABLET | Freq: Every day | ORAL | Status: AC
  Administered 2015-08-06 – 2015-08-12 (×7): 500 mg via ORAL
  Filled 2015-08-06 (×4): qty 1
  Filled 2015-08-06: qty 2
  Filled 2015-08-06 (×4): qty 1

## 2015-08-06 MED ORDER — OLANZAPINE 2.5 MG PO TABS
2.5000 mg | ORAL_TABLET | Freq: Every day | ORAL | Status: DC
  Administered 2015-08-06 – 2015-08-07 (×2): 2.5 mg via ORAL
  Filled 2015-08-06 (×2): qty 1

## 2015-08-06 MED ORDER — ERYTHROMYCIN BASE 250 MG PO TBEC
500.0000 mg | DELAYED_RELEASE_TABLET | Freq: Three times a day (TID) | ORAL | Status: DC
  Filled 2015-08-06 (×3): qty 2

## 2015-08-06 MED ORDER — OLANZAPINE 5 MG PO TBDP
5.0000 mg | ORAL_TABLET | Freq: Three times a day (TID) | ORAL | Status: DC | PRN
  Administered 2015-08-06: 5 mg via ORAL
  Filled 2015-08-06 (×2): qty 1

## 2015-08-06 NOTE — Progress Notes (Signed)
[facility] MD Progress Note  08/06/2015 3:25 PM Erik Burkett  MRN:  [id number]   Subjective:  Patient states " I am having AH of demons asking me to hurt myself by digging in to my skin. "   Objective: Dreshaun Stene is a 54 y.o. male suffering with paranoid schizophrenia and self-injurious behaviors and also following up with his ACT Team, psychotherapeutic services who called EMS on his behalf. Patient seen and chart reviewed.Discussed patient with treatment team.   Pt continues to have demonic AH asking him to harm  himself .Pt has self-inflicted wounds on his chest, arms, and legs. Pt states that his Kasandra Knudsen has stopped working and does not help any more . Pt reports being tried on Risperidone in the past .He is not able to give detailed hx of medications he has used in the past , or what he is on now. Pt does report that he has not been tried on Zyprexa in the past.  Pt reports that he also has multiple medical problems , is wheel chair bound due to degenerative arthritis . Pt also needs assistance walking to the bathroom , using a shower as well as feeding self at times. Pt reports that his GH has given him 30 day notice and he has to go to a higher level nursing home. Pt is also stressed out about this change. Per nursing - pt with psychosis , multiple superficial wounds on his skin, continues to need encouragement and support.    Principal Problem: Schizophrenia Diagnosis:   Patient Active Problem List   Diagnosis Date Noted  . Schizophrenia [F20.9] 08/04/2015  . Self-injurious behavior [F48.9]    Total Time spent with patient: 30 minutes   Past Medical History:  Past Medical History  Diagnosis Date  . Sleep apnea   . Schizophrenia   . Bipolar disorder   . Depression   . COPD (chronic obstructive pulmonary disease)   . Seizures   . Arthritis    Family History:  Family History  Problem Relation Age of Onset  . Diabetes Mother   . Mental illness Neg Hx   . Alcoholism Neg  Hx    Social History:  History  Alcohol Use No     History  Drug Use No    Social History   Social History  . Marital Status: Single    Spouse Name: N/A  . Number of Children: N/A  . Years of Education: N/A   Social History Main Topics  . Smoking status: Never Smoker   . Smokeless tobacco: None  . Alcohol Use: No  . Drug Use: No  . Sexual Activity: Not Asked   Other Topics Concern  . None   Social History Narrative   Additional History:    Sleep: Good  Appetite:  Fair   Musculoskeletal: Strength & Muscle Tone: decreased Gait & Station: wheel chair bound Patient leans: N/A   Psychiatric Specialty Exam: Physical Exam  Skin:  Multiple superficial skin excoriations noted on his forearm    Review of Systems  Skin:       Has skin excoriation all over his forearm, chest  Psychiatric/Behavioral: Positive for depression and hallucinations. The patient is nervous/anxious.   All other systems reviewed and are negative.   Blood pressure 112/70, pulse 58, temperature 97.7 F (36.5 C), temperature source Oral, resp. rate 18, height 5\' 5"  (1.651 m), weight 154.223 kg (340 lb).Body mass index is 56.58 kg/(m^2).  General Appearance: Bizarre, Disheveled, Guarded and Poor hygiene  Eye Contact::  Fair  Speech:  Clear and Coherent and Slow  Volume:  Decreased  Mood:  Depressed  Affect:  Depressed  Thought Process:  Disorganized  Orientation:  Full (Time, Place, and Person)  Thought Content:  Delusions, Hallucinations: Auditory, Paranoid Ideation and Rumination  Suicidal Thoughts:  No  Homicidal Thoughts:  No  Memory:  Immediate;   Fair Recent;   Fair Remote;   Poor  Judgement:  Impaired  Insight:  Shallow  Psychomotor Activity:  Psychomotor Retardation  Concentration:  Fair  Recall:  [employer] of Knowledge:Fair  Language: Fair  Akathisia:  Negative  Handed:  Right  AIMS (if indicated):     Assets:  Communication Skills Desire for Improvement Financial  Resources/Insurance Housing Leisure Time Resilience  ADL's:  Intact  Cognition: WNL  Sleep:  Number of Hours: 6.5     Current Medications: Current Facility-Administered Medications  Medication Dose Route Frequency Provider Last Rate Last Dose  . acetaminophen (TYLENOL) tablet 650 mg  650 mg Oral Q6H PRN Worthy Flank, NP   650 mg at 08/05/15 2130  . albuterol (PROVENTIL HFA;VENTOLIN HFA) 108 (90 BASE) MCG/ACT inhaler 1 puff  1 puff Inhalation Q4H PRN Worthy Flank, NP      . alum & mag hydroxide-simeth (MAALOX/MYLANTA) 200-200-20 MG/5ML suspension 30 mL  30 mL Oral Q4H PRN Worthy Flank, NP      . amLODipine (NORVASC) tablet 5 mg  5 mg Oral Q breakfast Worthy Flank, NP   5 mg at 08/06/15 0753  . balsalazide (COLAZAL) capsule 2,250 mg  2,250 mg Oral BID Worthy Flank, NP   2,250 mg at 08/06/15 0753  . calcium acetate (PHOSLO) capsule 1,334 mg  1,334 mg Oral Q breakfast Worthy Flank, NP   1,334 mg at 08/06/15 0753  . carbamazepine (TEGRETOL) tablet 400 mg  400 mg Oral QHS Worthy Flank, NP   400 mg at 08/05/15 2131  . FLUoxetine (PROZAC) capsule 40 mg  40 mg Oral BID Worthy Flank, NP   40 mg at 08/06/15 0802  . fluticasone (FLONASE) 50 MCG/ACT nasal spray 1 spray  1 spray Each Nare Daily Worthy Flank, NP   1 spray at 08/06/15 0801  . fluticasone (FLOVENT HFA) 110 MCG/ACT inhaler 2 puff  2 puff Inhalation BID Worthy Flank, NP   2 puff at 08/06/15 0754  . gabapentin (NEURONTIN) capsule 300 mg  300 mg Oral 2 times per day Worthy Flank, NP   300 mg at 08/06/15 0753  . gabapentin (NEURONTIN) capsule 600 mg  600 mg Oral QHS Worthy Flank, NP   600 mg at 08/05/15 2130  . levofloxacin (LEVAQUIN) tablet 500 mg  500 mg Oral Daily Callaway Hailes, MD      . lisinopril (PRINIVIL,ZESTRIL) tablet 5 mg  5 mg Oral Q breakfast Worthy Flank, NP   5 mg at 08/06/15 0753  . lurasidone (LATUDA) tablet 80 mg  80 mg Oral QHS Christmas Faraci, MD      . magnesium hydroxide (MILK OF  MAGNESIA) suspension 30 mL  30 mL Oral Daily PRN Worthy Flank, NP      . methocarbamol (ROBAXIN) tablet 750 mg  750 mg Oral TID Sanjuana Kava, NP   750 mg at 08/06/15 1303  . montelukast (SINGULAIR) tablet 10 mg  10 mg Oral Q breakfast Worthy Flank, NP   10 mg at 08/06/15 0753  . nystatin cream (  MYCOSTATIN)   Topical BID Dione Housekeeper, RN      . OLANZapine (ZYPREXA) tablet 2.5 mg  2.5 mg Oral BH-qamhs Mystie Ormand, MD      . OLANZapine (ZYPREXA) tablet 2.5 mg  2.5 mg Oral Q lunch Danielle Mink, MD   2.5 mg at 08/06/15 1303  . OLANZapine zydis (ZYPREXA) disintegrating tablet 5 mg  5 mg Oral TID PRN Jomarie Longs, MD      . ondansetron (ZOFRAN-ODT) disintegrating tablet 4 mg  4 mg Oral Q8H PRN Thermon Leyland, NP      . pantoprazole (PROTONIX) EC tablet 40 mg  40 mg Oral Daily Worthy Flank, NP   40 mg at 08/06/15 0753  . polymixin-bacitracin (POLYSPORIN) ointment   Topical BID Jomarie Longs, MD      . polyvinyl alcohol (LIQUIFILM TEARS) 1.4 % ophthalmic solution 1 drop  1 drop Both Eyes QID Worthy Flank, NP   1 drop at 08/06/15 0754  . psyllium (HYDROCIL/METAMUCIL) packet 1 packet  1 packet Oral Q breakfast Worthy Flank, NP   1 packet at 08/06/15 0754  . tamsulosin (FLOMAX) capsule 0.4 mg  0.4 mg Oral QHS Worthy Flank, NP   0.4 mg at 08/05/15 2130  . topiramate (TOPAMAX) tablet 100 mg  100 mg Oral TID Worthy Flank, NP   100 mg at 08/06/15 1304  . Vitamin D (Ergocalciferol) (DRISDOL) capsule 50,000 Units  50,000 Units Oral Q Sun Worthy Flank, NP   50,000 Units at 08/05/15 1147    Lab Results:  Results for orders placed or performed during the hospital encounter of 08/04/15 (from the past 48 hour(s))  Carbamazepine level, total     Status: None   Collection Time: 08/06/15  6:55 AM  Result Value Ref Range   Carbamazepine Lvl 5.5 4.0 - 12.0 ug/mL    Comment: Performed at [facility]  Lipid panel     Status: Abnormal   Collection Time: 08/06/15  6:55 AM   Result Value Ref Range   Cholesterol 194 0 - 200 mg/dL   Triglycerides 161 (H) <150 mg/dL   HDL 44 >09 mg/dL   Total CHOL/HDL Ratio 4.4 RATIO   VLDL 37 0 - 40 mg/dL   LDL Cholesterol 604 (H) 0 - 99 mg/dL    Comment:        Total Cholesterol/HDL:CHD Risk Coronary Heart Disease Risk Table                     Men   Women  1/2 Average Risk   3.4   3.3  Average Risk       5.0   4.4  2 X Average Risk   9.6   7.1  3 X Average Risk  23.4   11.0        Use the calculated Patient Ratio above and the CHD Risk Table to determine the patient's CHD Risk.        ATP III CLASSIFICATION (LDL):  <100     mg/dL   Optimal  540-981  mg/dL   Near or Above                    Optimal  130-159  mg/dL   Borderline  191-478  mg/dL   High  >295     mg/dL   Very High Performed at [facility]   TSH     Status: None   Collection  Time: 08/06/15  6:55 AM  Result Value Ref Range   TSH 4.034 0.350 - 4.500 uIU/mL    Comment: Performed at [facility]  T4, free     Status: None   Collection Time: 08/06/15  6:55 AM  Result Value Ref Range   Free T4 0.70 0.61 - 1.12 ng/dL    Comment: Performed at [facility]    Physical Findings: AIMS: Facial and Oral Movements Muscles of Facial Expression: None, normal Lips and Perioral Area: None, normal Jaw: None, normal Tongue: None, normal,Extremity Movements Upper (arms, wrists, hands, fingers): None, normal Lower (legs, knees, ankles, toes): None, normal, Trunk Movements Neck, shoulders, hips: None, normal, Overall Severity Severity of abnormal movements (highest score from questions above): None, normal Incapacitation due to abnormal movements: None, normal Patient's awareness of abnormal movements (rate only patient's report): No Awareness, Dental Status Current problems with teeth and/or dentures?: No Does patient usually wear dentures?: No  CIWA:    COWS:      Assessment: Patient presented with psychotic symptoms,  paranoid delusions, auditory hallucinations and self-injurious behaviors and unable to respond to outpatient medication management including ACT team. Pt continues to be psychotic , will need treatment and support.    Treatment Plan Summary: Daily contact with patient to assess and evaluate symptoms and progress in treatment and Medication management   Treatment Plan/Recommendations:   Reviewed past medical records,treatment plan.  Will taper off Latuda , start Zyprexa 2.5 mg po tid for psychosis/mood lability. Will continue Prozac 40 mg po bid for affective sx. Will also continue his Tegretol 400 mg po qhs , Topamax 100 mg po tid - these were his home medications . CSW to contact ACTT to get a better understanding of his current medications . Will add Polymixin for wound care / dressing changes . Add Levaquin 500 mg po daily for wound , empirical for infection. Restart home medications where indicated . Will continue to monitor vitals ,medication compliance and treatment side effects while patient is here.  Will monitor for medical issues as well as call consult as needed.  Reviewed labs ,TSH -wnl, Lipid panel - LDL -slightly high. Pt is also morbidly obese , with multiple medical problems - will consult Dietician. Will also get EKG- qtc , Hba1c, PL level. CSW will start working on disposition.  Patient to participate in therapeutic milieu .       Medical Decision Making:  Review of Psycho-Social Stressors (1), Review or order clinical lab tests (1), Established Problem, Worsening (2), Review of Last Therapy Session (1), Review or order medicine tests (1), Review of Medication Regimen & Side Effects (2) and Review of New Medication or Change in Dosage (2)     Jodette Wik md 08/06/2015, 3:25 PM

## 2015-08-06 NOTE — Progress Notes (Signed)
D: Pt has depressed, anxious affect and mood.  Upon initial approach, pt is in his room with his gown lifted, exposing himself.  When Probation officer entered room, pt covered himself appropriately.  MHT reported that pt requested a male nurse tonight.  He was unable to give a reason as to why he wanted a male nurse.  He reports he has "been hearing voices telling me to dig in my arms, I'm trying not to."  He reports his goal tonight is to "try to get some good sleep."  Pt was encouraged to attend evening group.  He got out of bed and into his wheelchair without assistance and attended evening group in day room.  He reports he has been using a urinal to void and agrees to continue to do so.  He denies SI/HI, denies visual hallucinations, reports chronic right hip pain of 10/10.  Pt has been visible in milieu with minimal peer interaction.   A: Introduced self to pt.  Met with pt 1:1 and provided support and encouragement.  Actively listened to pt.  Medications administered per order except for nystatin cream, which pt refused.  PRN medication administered for pain and anxiety.  Dressing applied to both hands and left arm. R: Pt is compliant with medications except nystatin cream.  Pt verbally contracts for safety and reports that he will notify staff of needs and concerns.  Will continue to monitor and assess.

## 2015-08-06 NOTE — Progress Notes (Signed)
[facility] Group Notes:  (Nursing/MHT/Case Management/Adjunct)  Date:  08/06/2015  Time:  9:36 PM  Type of Therapy:  Psychoeducational Skills  Participation Level:  Active  Participation Quality:  Appropriate  Affect:  Flat  Cognitive:  Appropriate  Insight:  Improving  Engagement in Group:  Improving  Modes of Intervention:  Education  Summary of Progress/Problems: Patient states that he had a bad day overall. He indicated that he continues to hear "demonic voices". He also acknowledges that he attended the groups in the morning and that he understood that their have some medication changes as well. As a theme for the day, his wellness strategies will be to work on his diagnosis since he wishes that he were no longer mentally ill and to speak with the staff about his housing disposition. He understands that the case manager is exploring the possibility of sending him to a nursing home and he is not comfortable with that decision.   Edwin Henderson S 08/06/2015, 9:36 PM

## 2015-08-06 NOTE — Plan of Care (Signed)
Problem: Alteration in thought process Goal: LTG-Patient has not harmed self or others in at least 2 days Outcome: Progressing Patient has not harmed self or others in 2 days.     

## 2015-08-06 NOTE — Progress Notes (Signed)
Pt presents with depressed mood, affect anxious. Edwin Henderson reports he is continuing to have auditory hallucinations stating '' they tell me to scratch and dig on myself. '' patient states '' i've always had the voices, but they are just nagging on me today. '' Pt also reported having '' tourettes '' and states he is not on medication for that. He requested cream for his arms. he reports sleeping and eating well. He denies any suicidal thoughts. Staff note that patient appears to have some attention seeking behaviors, with incontinent episode x 2 this shift. He told writer '' I'll need someone to put me on the bedpan if I need to go later. '' Patient has not been having incontinent episodes on prior shift and has been able to ambulate to the bathroom from wheelchair per staff report. Patient then wrote on his self inventory sheet '' i would like a nurse to masturbate me . '' Patient is also noted to sleep in his room with no gown on over genital area despite being redirected from staff to do so.A. Medications given as ordered, support, redirection provided as needed. Discussed pt complaints of AH and requests for cream with Dr. Elna Breslow and treatment team  R. Pt is safe, will continue to monitor q 15 minutes as ordered.

## 2015-08-06 NOTE — [facility] Group Notes (Signed)
[facility] LCSW Group Therapy  08/06/2015 3:20 PM   Type of Therapy:  Group Therapy  Participation Level:  Invited.  Chose to not attend.  Summary of Progress/Problems: Today's group focused on relapse prevention.  We defined the term, and then brainstormed on ways to prevent relapse.    Edwin Henderson, Edwin Henderson 08/06/2015 , 3:20 PM

## 2015-08-06 NOTE — Tx Team (Signed)
Interdisciplinary Treatment Plan Update (Adult)  Date:  08/06/2015   Time Reviewed:  8:55 AM   Progress in Treatment: Attending groups: Intermittently Participating in groups:  Intermittently Taking medication as prescribed:  Yes. Tolerating medication:  Yes. Family/Significant other contact made:  Yes Patient understands diagnosis:  Yes  As evidenced by seeking help with command hallucinations Discussing patient identified problems/goals with staff:  Yes, see initial care plan. Medical problems stabilized or resolved:  Yes. Denies suicidal/homicidal ideation: Yes. Issues/concerns per patient self-inventory:  No. Other:  New problem(s) identified:  Discharge Plan or Barriers: return to [facility], follow up with ACT team  Reason for Continuation of Hospitalization: Medication stabilization Other; describe command hallucinations  Comments:  " I am having AH of demons asking me to hurt myself by digging in to my skin. "   Objective: Edwin Henderson is a 54 y.o. male suffering with paranoid schizophrenia and self-injurious behaviors and also following up with his ACT Team, psychotherapeutic services who called EMS on his behalf. Patient seen and chart reviewed.Discussed patient with treatment team.  Pt continues to have demonic AH asking him to harm himself .Pt has self-inflicted wounds on his chest, arms, and legs. Pt states that his [name] has stopped working and does not help any more . Pt reports being tried on Risperidone in the past .He is not able to give detailed hx of medications he has used in the past , or what he is on now. Pt does report that he has not been tried on Zyprexa in the past. Pt reports that he also has multiple medical problems , is wheel chair bound due to degenerative arthritis . Pt also needs assistance walking to the bathroom , using a shower as well as feeding self at times. Pt reports that his [place] has given him 30 day notice and he has to go to a higher  level nursing home. Pt is also stressed out about this change.  Estimated length of stay: 4-5 days  New goal(s):  Review of initial/current patient goals per problem list:   Review of initial/current patient goals per problem list:  1. Goal(s): Patient will participate in aftercare plan   Met:    Target date: 3-5 days post admission date   As evidenced by: Patient will participate within aftercare plan AEB aftercare provider and housing plan at discharge being identified.  08/06/15:  Plans to return to [facility], follow up with [facility] team     5. Goal(s): Patient will demonstrate decreased signs of psychosis  * Met: No  * Target date: 3-5 days post admission date  * As evidenced by: Patient will demonstrate decreased frequency of AVH or return to baseline function Pt reports command hallucinations telling him to dig in his skin.  States his medication is no longer working.          Attendees: Patient:  08/06/2015 8:55 AM   Family:   08/06/2015 8:55 AM   Physician:  Ursula Alert, MD 08/06/2015 8:55 AM   Nursing:   Gaylan Gerold, RN 08/06/2015 8:55 AM   CSW:    Roque Lias, LCSW   08/06/2015 8:55 AM   Other:  08/06/2015 8:55 AM   Other:   08/06/2015 8:55 AM   Other:  Lars Pinks, Nurse CM 08/06/2015 8:55 AM   Other:  Lucinda Dell, [facility] 08/06/2015 8:55 AM   Other:  Norberto Sorenson, [facility]  08/06/2015 8:55 AM   Other:  08/06/2015 8:55 AM   Other:  08/06/2015  8:55 AM   Other:  08/06/2015 8:55 AM   Other:  08/06/2015 8:55 AM   Other:  08/06/2015 8:55 AM   Other:   08/06/2015 8:55 AM    Scribe for Treatment Team:   Trish Mage, 08/06/2015 8:55 AM

## 2015-08-06 NOTE — Progress Notes (Signed)
Patient was observed lying in bed this evening at shift change and has been in the bed all evening. Writer encouraged him to come to the dayroom but he declined. He reported still having auditory hallucinations and wanting to scratch at his scars on his arms. Denies hi and has passive si and verbally contracts for safety. He did not attend group and received snacks before taking his scheduled hs meds. Support given and safety maintained on unit with 15 min checks.

## 2015-08-06 NOTE — [facility] Group Notes (Signed)
[facility] LCSW Aftercare Discharge Planning Group Note   08/06/2015 3:16 PM  Participation Quality:  Engaged  Mood/Affect:  Tearful  Depression Rating:  10  Anxiety Rating:  10  Thoughts of Suicide:  No Will you contract for safety?   NA  Current AVH:  Yes  Plan for Discharge/Comments:  Dramatic man with tears for effect.  "I hate getting old.  I can't work.  I'm lonely.  No one wants to help."  Prior to the breakdown, explained it was his idea to come in "because I was hearing voices, which is new, and I was digging into my skin" [holding up both arms for effect].  Stays in a [facility]; states they gave him 30 day notice "because I have too many medical problems for them to handle."  Works with [facility] ACT team.  Transportation Means:   Supports:  Edwin Henderson

## 2015-08-06 NOTE — Plan of Care (Signed)
Problem: Alteration in mood Goal: LTG-Patient reports reduction in suicidal thoughts (Patient reports reduction in suicidal thoughts and is able to verbalize a safety plan for whenever patient is feeling suicidal)  Outcome: Progressing Pt denies suicidal thoughts this shift and verbally contracts for safety.

## 2015-08-07 DIAGNOSIS — F203 Undifferentiated schizophrenia: Secondary | ICD-10-CM

## 2015-08-07 MED ORDER — LURASIDONE HCL 40 MG PO TABS
40.0000 mg | ORAL_TABLET | Freq: Every day | ORAL | Status: DC
  Administered 2015-08-07: 40 mg via ORAL
  Filled 2015-08-07 (×4): qty 1

## 2015-08-07 MED ORDER — FLUOXETINE HCL 20 MG PO CAPS
40.0000 mg | ORAL_CAPSULE | Freq: Two times a day (BID) | ORAL | Status: AC
  Administered 2015-08-07: 40 mg via ORAL
  Filled 2015-08-07: qty 2

## 2015-08-07 MED ORDER — TRAZODONE HCL 50 MG PO TABS
50.0000 mg | ORAL_TABLET | Freq: Every day | ORAL | Status: DC
  Administered 2015-08-07: 50 mg via ORAL
  Filled 2015-08-07 (×3): qty 1

## 2015-08-07 MED ORDER — FLUOXETINE HCL 20 MG PO CAPS
80.0000 mg | ORAL_CAPSULE | Freq: Every day | ORAL | Status: DC
  Administered 2015-08-08 – 2015-08-13 (×6): 80 mg via ORAL
  Filled 2015-08-07 (×10): qty 4

## 2015-08-07 MED ORDER — OLANZAPINE 5 MG PO TABS
5.0000 mg | ORAL_TABLET | Freq: Every day | ORAL | Status: DC
Start: 2015-08-08 — End: 2015-08-10
  Administered 2015-08-08 – 2015-08-10 (×3): 5 mg via ORAL
  Filled 2015-08-07 (×5): qty 1

## 2015-08-07 NOTE — Progress Notes (Signed)
D:Pt presents in assigned bed at start of shift. Pt reports passive SI. Pt denies HI. Pt endorsing auditory hallucinations "The voices are telling me to pick at my skin." Pt denies visual hallucinations

## 2015-08-07 NOTE — Progress Notes (Signed)
Adult Psychoeducational Group Note  Date:  08/07/2015 Time:  8:15 PM  Group Topic/Focus:  Wrap-Up Group:   The focus of this group is to help patients review their daily goal of treatment and discuss progress on daily workbooks.  Participation Level:  Active  Participation Quality:  Sharing  Affect:  Depressed  Cognitive:  Appropriate  Insight: Appropriate  Engagement in Group:  Engaged  Modes of Intervention:  Discussion  Additional Comments:  Pt reported that he had a bad day, he rated his day a 1 out of 10. When asked what mad his day so bad, pt began to tear up and just stated "I felt sad, it was just a bad day." Pt was encouraged by this writer to think of something positive that happened today, and the pt mentioned that the food here was really good. Once pt started talking about food, his mood seemed enlightened and pt began to excessively talk about the food at [facility].   Cleotilde Neer 08/07/2015, 8:53 PM

## 2015-08-07 NOTE — [facility] Group Notes (Signed)
[facility] Group Notes:  (Nursing/MHT/Case Management/Adjunct)  Date:  08/07/2015  Time:  0930  Type of Therapy:  Nurse Education  Participation Level:  Minimal  Participation Quality:  Appropriate  Affect:  Appropriate  Cognitive:  Oriented  Insight:  Good  Engagement in Group:  Improving  Modes of Intervention:  Activity, Discussion, Education, Socialization and Support  Summary of Progress/Problems:  Dara Hoyer 08/07/2015, 10:16 AM

## 2015-08-07 NOTE — Progress Notes (Signed)
[facility] MD Progress Note  08/07/2015 12:10 PM Edwin Henderson  MRN:  [id number]   Subjective:  Patient states " I did not sleep well last night. I still have AH asking me to hurt myself ."   Objective: Edwin Henderson is a 54 y.o. male suffering with paranoid schizophrenia and self-injurious behaviors and also following up with his ACT Team, psychotherapeutic services who called EMS on his behalf. Patient seen and chart reviewed.Discussed patient with treatment team.   Pt continues to have demonic AH asking him to harm  Himself by digging in to his skin .Pt has self-inflicted wounds on his chest, arms, and legs.Wounds covered with dressing - dressing appears clean. Pt also appears disheveled , is malodorous , poor hygiene.  Pt with sleep issues - discussed adding trazodone, he agrees with plan. Pt currently being cross titrated on Latuda/Zyprexa . Per nursing - pt is psychotic , has been sexually inappropriate - made statements his self inventory sheet today stating " I want the nurses to masturbate me.'     Pt  with multiple medical problems , is wheel chair bound due to degenerative arthritis . Pt also needs assistance walking to the bathroom , using a shower as well as feeding self at times. Pt reports that his [facility] has given him 30 day notice and he has to go to a higher level nursing home.   Principal Problem: Schizophrenia Diagnosis:   Patient Active Problem List   Diagnosis Date Noted  . HTN (hypertension) [I10] 08/06/2015  . Schizophrenia [F20.9] 08/04/2015  . Self-injurious behavior [F48.9]    Total Time spent with patient: 30 minutes   Past Medical History:  Past Medical History  Diagnosis Date  . Sleep apnea   . Schizophrenia   . Bipolar disorder   . Depression   . COPD (chronic obstructive pulmonary disease)   . Seizures   . Arthritis    Family History:  Family History  Problem Relation Age of Onset  . Diabetes Mother   . Mental illness Neg Hx   . Alcoholism Neg  Hx    Social History:  History  Alcohol Use No     History  Drug Use No    Social History   Social History  . Marital Status: Single    Spouse Name: N/A  . Number of Children: N/A  . Years of Education: N/A   Social History Main Topics  . Smoking status: Never Smoker   . Smokeless tobacco: None  . Alcohol Use: No  . Drug Use: No  . Sexual Activity: Not Asked   Other Topics Concern  . None   Social History Narrative   Additional History:    Sleep: Poor  Appetite:  Fair   Musculoskeletal: Strength & Muscle Tone: decreased Gait & Station: wheel chair bound Patient leans: N/A   Psychiatric Specialty Exam: Physical Exam  Skin:  Multiple superficial skin excoriations noted on his forearm    Review of Systems  Skin:       Has skin excoriation all over his forearm, chest  Psychiatric/Behavioral: Positive for depression and hallucinations. The patient is nervous/anxious.   All other systems reviewed and are negative.   Blood pressure 109/69, pulse 72, temperature 97.8 F (36.6 C), temperature source Oral, resp. rate 18, height 5\' 5"  (1.651 m), weight 154.223 kg (340 lb).Body mass index is 56.58 kg/(m^2).  General Appearance: Bizarre, Disheveled, Guarded and Poor hygiene  Eye Contact::  Fair  Speech:  Clear and Coherent and  Slow  Volume:  Decreased  Mood:  Anxious and Depressed  Affect:  Depressed  Thought Process:  Disorganized  Orientation:  Full (Time, Place, and Person)  Thought Content:  Delusions, Hallucinations: Auditory, Paranoid Ideation and Rumination  Suicidal Thoughts:  AH asking him to hurt self - he is trying his best not to  Homicidal Thoughts:  No  Memory:  Immediate;   Fair Recent;   Fair Remote;   Poor  Judgement:  Impaired  Insight:  Shallow  Psychomotor Activity:  Psychomotor Retardation  Concentration:  Fair  Recall:  [employer] of Knowledge:Fair  Language: Fair  Akathisia:  Negative  Handed:  Right  AIMS (if indicated):      Assets:  Communication Skills Desire for Improvement Financial Resources/Insurance Housing Leisure Time Resilience  ADL's:  Intact  Cognition: WNL  Sleep:  Number of Hours: 7.25     Current Medications: Current Facility-Administered Medications  Medication Dose Route Frequency Provider Last Rate Last Dose  . acetaminophen (TYLENOL) tablet 650 mg  650 mg Oral Q6H PRN Worthy Flank, NP   650 mg at 08/06/15 2025  . albuterol (PROVENTIL HFA;VENTOLIN HFA) 108 (90 BASE) MCG/ACT inhaler 1 puff  1 puff Inhalation Q4H PRN Worthy Flank, NP      . alum & mag hydroxide-simeth (MAALOX/MYLANTA) 200-200-20 MG/5ML suspension 30 mL  30 mL Oral Q4H PRN Worthy Flank, NP      . amLODipine (NORVASC) tablet 5 mg  5 mg Oral Q breakfast Worthy Flank, NP   5 mg at 08/07/15 0919  . balsalazide (COLAZAL) capsule 2,250 mg  2,250 mg Oral BID Worthy Flank, NP   2,250 mg at 08/07/15 0920  . calcium acetate (PHOSLO) capsule 1,334 mg  1,334 mg Oral Q breakfast Worthy Flank, NP   1,334 mg at 08/07/15 0918  . carbamazepine (TEGRETOL) tablet 400 mg  400 mg Oral QHS Worthy Flank, NP   400 mg at 08/06/15 2108  . FLUoxetine (PROZAC) capsule 40 mg  40 mg Oral BID Jomarie Longs, MD      . Melene Muller ON 08/08/2015] FLUoxetine (PROZAC) capsule 80 mg  80 mg Oral Daily Orbie Grupe, MD      . fluticasone (FLONASE) 50 MCG/ACT nasal spray 1 spray  1 spray Each Nare Daily Worthy Flank, NP   1 spray at 08/07/15 0923  . fluticasone (FLOVENT HFA) 110 MCG/ACT inhaler 2 puff  2 puff Inhalation BID Worthy Flank, NP   2 puff at 08/07/15 0922  . gabapentin (NEURONTIN) capsule 300 mg  300 mg Oral 2 times per day Worthy Flank, NP   300 mg at 08/07/15 0918  . gabapentin (NEURONTIN) capsule 600 mg  600 mg Oral QHS Worthy Flank, NP   600 mg at 08/06/15 2108  . levofloxacin (LEVAQUIN) tablet 500 mg  500 mg Oral Daily Jomarie Longs, MD   500 mg at 08/07/15 1610  . lisinopril (PRINIVIL,ZESTRIL) tablet 5 mg  5 mg Oral  Q breakfast Worthy Flank, NP   5 mg at 08/07/15 0918  . lurasidone (LATUDA) tablet 80 mg  80 mg Oral QHS Jomarie Longs, MD   80 mg at 08/06/15 2108  . magnesium hydroxide (MILK OF MAGNESIA) suspension 30 mL  30 mL Oral Daily PRN Worthy Flank, NP      . methocarbamol (ROBAXIN) tablet 750 mg  750 mg Oral TID Sanjuana Kava, NP   750 mg at 08/07/15 1150  .  montelukast (SINGULAIR) tablet 10 mg  10 mg Oral Q breakfast Worthy Flank, NP   10 mg at 08/07/15 0919  . nystatin cream (MYCOSTATIN)   Topical BID Dione Housekeeper, RN      . OLANZapine [facility]) tablet 2.5 mg  2.5 mg Oral BH-qamhs Arynn Armand, MD   2.5 mg at 08/07/15 0921  . [START ON 08/08/2015] OLANZapine (ZYPREXA) tablet 5 mg  5 mg Oral Q lunch Kassie Keng, MD      . OLANZapine zydis (ZYPREXA) disintegrating tablet 5 mg  5 mg Oral TID PRN Jomarie Longs, MD   5 mg at 08/06/15 2025  . ondansetron (ZOFRAN-ODT) disintegrating tablet 4 mg  4 mg Oral Q8H PRN Thermon Leyland, NP      . pantoprazole (PROTONIX) EC tablet 40 mg  40 mg Oral Daily Worthy Flank, NP   40 mg at 08/07/15 0921  . polymixin-bacitracin (POLYSPORIN) ointment   Topical BID Jomarie Longs, MD      . polyvinyl alcohol (LIQUIFILM TEARS) 1.4 % ophthalmic solution 1 drop  1 drop Both Eyes QID Worthy Flank, NP   1 drop at 08/07/15 1151  . psyllium (HYDROCIL/METAMUCIL) packet 1 packet  1 packet Oral Q breakfast Worthy Flank, NP   1 packet at 08/07/15 [id number]  . tamsulosin (FLOMAX) capsule 0.4 mg  0.4 mg Oral QHS Worthy Flank, NP   0.4 mg at 08/06/15 2108  . topiramate (TOPAMAX) tablet 100 mg  100 mg Oral TID Worthy Flank, NP   100 mg at 08/07/15 1150  . traZODone (DESYREL) tablet 50 mg  50 mg Oral QHS Jomarie Longs, MD      . Vitamin D (Ergocalciferol) (DRISDOL) capsule 50,000 Units  50,000 Units Oral Q Sun Worthy Flank, NP   50,000 Units at 08/05/15 1147    Lab Results:  Results for orders placed or performed during the hospital encounter of 08/04/15  (from the past 48 hour(s))  Carbamazepine level, total     Status: None   Collection Time: 08/06/15  6:55 AM  Result Value Ref Range   Carbamazepine Lvl 5.5 4.0 - 12.0 ug/mL    Comment: Performed at [facility]  Lipid panel     Status: Abnormal   Collection Time: 08/06/15  6:55 AM  Result Value Ref Range   Cholesterol 194 0 - 200 mg/dL   Triglycerides 960 (H) <150 mg/dL   HDL 44 >45 mg/dL   Total CHOL/HDL Ratio 4.4 RATIO   VLDL 37 0 - 40 mg/dL   LDL Cholesterol 409 (H) 0 - 99 mg/dL    Comment:        Total Cholesterol/HDL:CHD Risk Coronary Heart Disease Risk Table                     Men   Women  1/2 Average Risk   3.4   3.3  Average Risk       5.0   4.4  2 X Average Risk   9.6   7.1  3 X Average Risk  23.4   11.0        Use the calculated Patient Ratio above and the CHD Risk Table to determine the patient's CHD Risk.        ATP III CLASSIFICATION (LDL):  <100     mg/dL   Optimal  811-914  mg/dL   Near or Above  Optimal  130-159  mg/dL   Borderline  981-191  mg/dL   High  >478     mg/dL   Very High Performed at [facility]   TSH     Status: None   Collection Time: 08/06/15  6:55 AM  Result Value Ref Range   TSH 4.034 0.350 - 4.500 uIU/mL    Comment: Performed at [facility]  T4, free     Status: None   Collection Time: 08/06/15  6:55 AM  Result Value Ref Range   Free T4 0.70 0.61 - 1.12 ng/dL    Comment: Performed at [facility]    Physical Findings: AIMS: Facial and Oral Movements Muscles of Facial Expression: None, normal Lips and Perioral Area: None, normal Jaw: None, normal Tongue: None, normal,Extremity Movements Upper (arms, wrists, hands, fingers): None, normal Lower (legs, knees, ankles, toes): None, normal, Trunk Movements Neck, shoulders, hips: None, normal, Overall Severity Severity of abnormal movements (highest score from questions above): None, normal Incapacitation due to abnormal  movements: None, normal Patient's awareness of abnormal movements (rate only patient's report): No Awareness, Dental Status Current problems with teeth and/or dentures?: No Does patient usually wear dentures?: No  CIWA:    COWS:      Assessment: Patient presented with psychotic symptoms, paranoid delusions, auditory hallucinations and self-injurious behaviors and unable to respond to outpatient medication management including ACT team. Pt continues to be psychotic , as well as has sleep issues , will need treatment and support.    Treatment Plan Summary: Daily contact with patient to assess and evaluate symptoms and progress in treatment and Medication management   Treatment Plan/Recommendations:   Reviewed past medical records,treatment plan.  Will continue to cross taper Latuda with Zyprexa for  psychosis/mood lability. Will continue Prozac , but change dose to 80 mg po daily as a one time dose instead of BID for affective sx.Pt with sleep issues - unknown if prozac could be affecting his sleep - since he has been taking a bid dosing. Will also continue his Tegretol 400 mg po qhs , Topamax 100 mg po tid - these were his home medications . CSW to contact ACTT to get a better understanding of his current medications today. Will add Polymixin for wound care / dressing changes . Added Levaquin 500 mg po daily for wound , empirical for infection. Restarted home medications where indicated . Will continue to monitor vitals ,medication compliance and treatment side effects while patient is here.  Will monitor for medical issues as well as call consult as needed.  Reviewed labs ,TSH -wnl, Lipid panel - LDL -slightly high. Pt is also morbidly obese , with multiple medical problems -  Dietician consult placed.  Will also get EKG- qtc , Hba1c, PL level- pending . CSW will start working on disposition.  Patient to participate in therapeutic milieu .       Medical Decision Making:  Review of  Psycho-Social Stressors (1), Review or order clinical lab tests (1), Established Problem, Worsening (2), Review of Last Therapy Session (1), Review or order medicine tests (1), Review of Medication Regimen & Side Effects (2) and Review of New Medication or Change in Dosage (2)     Senetra Dillin md 08/07/2015, 12:10 PM

## 2015-08-07 NOTE — [facility] Group Notes (Signed)
[facility] LCSW Group Therapy  08/07/2015 1:15 pm  Type of Therapy: Process Group Therapy  Participation Level:  Active  Participation Quality:  Appropriate  Affect:  Flat  Cognitive:  Oriented  Insight:  Improving  Engagement in Group:  Limited  Engagement in Therapy:  Limited  Modes of Intervention:  Activity, Clarification, Education, Problem-solving and Support  Summary of Progress/Problems: Today's group addressed the issue of overcoming obstacles.  Patients were asked to identify their biggest obstacle post d/c that stands in the way of their on-going success, and then problem solve as to how to manage this.  Najir used the group as a forum to talk about himself.  Had little to do with the group theme.  Talked about his anger at the [facility] for giving him a 30 day notice.  Talked about how his sister has turned her vback on him.  Talked about  His wounds and how he feels compelled to cut himself.  Edgar, Reisz 08/07/2015   4:52 PM

## 2015-08-08 ENCOUNTER — Encounter (HOSPITAL_COMMUNITY): Payer: Self-pay | Admitting: Emergency Medicine

## 2015-08-08 ENCOUNTER — Ambulatory Visit (HOSPITAL_COMMUNITY): Payer: Medicare Other | Attending: Emergency Medicine

## 2015-08-08 DIAGNOSIS — E221 Hyperprolactinemia: Secondary | ICD-10-CM | POA: Clinically undetermined

## 2015-08-08 DIAGNOSIS — R079 Chest pain, unspecified: Secondary | ICD-10-CM | POA: Insufficient documentation

## 2015-08-08 LAB — CBC
HEMATOCRIT: 43.8 % (ref 39.0–52.0)
Hemoglobin: 14 g/dL (ref 13.0–17.0)
MCH: 30.2 pg (ref 26.0–34.0)
MCHC: 32 g/dL (ref 30.0–36.0)
MCV: 94.4 fL (ref 78.0–100.0)
Platelets: 225 10*3/uL (ref 150–400)
RBC: 4.64 MIL/uL (ref 4.22–5.81)
RDW: 14 % (ref 11.5–15.5)
WBC: 8.3 10*3/uL (ref 4.0–10.5)

## 2015-08-08 LAB — BASIC METABOLIC PANEL
ANION GAP: 7 (ref 5–15)
BUN: 16 mg/dL (ref 6–20)
CO2: 21 mmol/L — AB (ref 22–32)
Calcium: 8.4 mg/dL — ABNORMAL LOW (ref 8.9–10.3)
Chloride: 109 mmol/L (ref 101–111)
Creatinine, Ser: 1.11 mg/dL (ref 0.61–1.24)
GFR calc Af Amer: >60 mL/min (ref >60)
GFR calc non Af Amer: >60 mL/min (ref >60)
GLUCOSE: 112 mg/dL — AB (ref 65–99)
POTASSIUM: 4.2 mmol/L (ref 3.5–5.1)
Sodium: 137 mmol/L (ref 135–145)

## 2015-08-08 LAB — HEMOGLOBIN A1C
HEMOGLOBIN A1C: 6.1 % — AB (ref 4.8–5.6)
Mean Plasma Glucose: 128 mg/dL

## 2015-08-08 LAB — PROLACTIN: Prolactin: 60.5 ng/mL — ABNORMAL HIGH (ref 4.0–15.2)

## 2015-08-08 LAB — I-STAT TROPONIN, ED: Troponin i, poc: 0.01 ng/mL (ref 0.00–0.08)

## 2015-08-08 LAB — TROPONIN I

## 2015-08-08 MED ORDER — OLANZAPINE 2.5 MG PO TABS
2.5000 mg | ORAL_TABLET | ORAL | Status: DC
  Administered 2015-08-09: 2.5 mg via ORAL
  Filled 2015-08-08 (×2): qty 1

## 2015-08-08 MED ORDER — ACETAMINOPHEN 500 MG PO TABS
1000.0000 mg | ORAL_TABLET | Freq: Once | ORAL | Status: AC
  Administered 2015-08-08: 1000 mg via ORAL
  Filled 2015-08-08: qty 2

## 2015-08-08 MED ORDER — OLANZAPINE 7.5 MG PO TABS
7.5000 mg | ORAL_TABLET | Freq: Every day | ORAL | Status: DC
  Filled 2015-08-08: qty 1

## 2015-08-08 MED ORDER — TRAZODONE HCL 100 MG PO TABS
100.0000 mg | ORAL_TABLET | Freq: Every day | ORAL | Status: DC
  Administered 2015-08-08: 100 mg via ORAL
  Filled 2015-08-08 (×2): qty 1

## 2015-08-08 MED ORDER — NITROGLYCERIN 0.4 MG SL SUBL
0.4000 mg | SUBLINGUAL_TABLET | SUBLINGUAL | Status: DC | PRN
  Administered 2015-08-08 (×2): 0.4 mg via SUBLINGUAL
  Filled 2015-08-08: qty 1

## 2015-08-08 MED ORDER — ARIPIPRAZOLE 5 MG PO TABS
5.0000 mg | ORAL_TABLET | Freq: Every day | ORAL | Status: DC
  Administered 2015-08-08: 5 mg via ORAL
  Filled 2015-08-08 (×3): qty 1

## 2015-08-08 MED ORDER — ASPIRIN 81 MG PO CHEW
CHEWABLE_TABLET | ORAL | Status: AC
  Filled 2015-08-08: qty 4

## 2015-08-08 MED ORDER — OLANZAPINE 2.5 MG PO TABS
2.5000 mg | ORAL_TABLET | Freq: Every day | ORAL | Status: DC
Start: 2015-08-08 — End: 2015-08-09
  Administered 2015-08-08: 2.5 mg via ORAL
  Filled 2015-08-08 (×2): qty 1

## 2015-08-08 MED ORDER — ASPIRIN 81 MG PO CHEW
324.0000 mg | CHEWABLE_TABLET | Freq: Once | ORAL | Status: AC
  Administered 2015-08-08: 324 mg via ORAL

## 2015-08-08 NOTE — ED Notes (Addendum)
Pt reports chest pain, informed provider, prescribed medication.

## 2015-08-08 NOTE — Progress Notes (Signed)
Pt encouraged to take a bath , staff assist "I can't reach my back." Pt reports he will be ready after dinner. Will continue to monitor.

## 2015-08-08 NOTE — Significant Event (Cosign Needed)
Notified by NS, that patient was awoken with described sharp 10/10 left sided CP with radiation to his LUE while at rest over the last 20 minutes. Patient denies any crescendo pattern, related near syncope but is experiencing some diaphoreses and nausea. Known ACS risk factors include metabolic syndrome and HTN. 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 Patient is denying hx of CVA, TIA, HLD, PAD or PVD. Patient is a non smoker and without known family hx of premature CAD/AMI. Vital signs stable, review of baseline EKG, note normal sinus rythym, normal axis, with RBBB no ST elevation and or TWA appreciated. Patient put on supplemental O2 at 2L/min per [place] and x 4 baby asa given. EMS notified with plans to proceed with STAT transport to [facility] ED for further evaluation.  Gilmore Laroche MD  On Call Physician

## 2015-08-08 NOTE — Discharge Instructions (Signed)
Chest Pain (Nonspecific)  Edwin Henderson, your chest pain workup is normal today. See a primary care physician within 3 days for close follow-up of her chest pain and to finish her evaluation. If any symptoms worsen come back to emergency department immediately. Thank you.    It is often hard to give a diagnosis for the cause of chest pain. There is always a chance that your pain could be related to something serious, such as a heart attack or a blood clot in the lungs. You need to follow up with your doctor. HOME CARE  If antibiotic medicine was given, take it as directed by your doctor. Finish the medicine even if you start to feel better.  For the next few days, avoid activities that bring on chest pain. Continue physical activities as told by your doctor.  Do not use any tobacco products. This includes cigarettes, chewing tobacco, and e-cigarettes.  Avoid drinking alcohol.  Only take medicine as told by your doctor.  Follow your doctor's suggestions for more testing if your chest pain does not go away.  Keep all doctor visits you made. GET HELP IF:  Your chest pain does not go away, even after treatment.  You have a rash with blisters on your chest.  You have a fever. GET HELP RIGHT AWAY IF:   You have more pain or pain that spreads to your arm, neck, jaw, back, or belly (abdomen).  You have shortness of breath.  You cough more than usual or cough up blood.  You have very bad back or belly pain.  You feel sick to your stomach (nauseous) or throw up (vomit).  You have very bad weakness.  You pass out (faint).  You have chills. This is an emergency. Do not wait to see if the problems will go away. Call your local emergency services (911 in [place] Do not drive yourself to the hospital. MAKE SURE YOU:   Understand these instructions.  Will watch your condition.  Will get help right away if you are not doing well or get worse. Document Released: 04/28/2008 Document  Revised: 11/15/2013 Document Reviewed: 04/28/2008 [facility] Patient Information 2015 [place], [place]. This information is not intended to replace advice given to you by your health care provider. Make sure you discuss any questions you have with your health care provider.

## 2015-08-08 NOTE — [facility] Group Notes (Signed)
[facility] LCSW Group Therapy  08/08/2015 2:52 PM  Type of Therapy: Group Therapy  Participation Level: Active  Participation Quality: Attentive  Affect: Flat  Cognitive: Oriented  Insight: Limited  Engagement in Therapy: Engaged  Modes of Intervention: Discussion and Socialization  Summary of Progress/Problems: Onalee Hua from [employer] was here to tell his story of recovery and play his guitar. Pt was attentive but quiet during group. He sat through the entire group session and was alert the entire time.  Vito Backers. Beverely Pace 08/08/2015 2:52 PM

## 2015-08-08 NOTE — Progress Notes (Signed)
NUTRITION NOTE  Pt seen for consult: Morbid obesity , wheel chair bound , lipids abnormal , on multiple psychotropics which can cause hyperlipidemia.  Talked with pt about a generally healthy diet to target morbid obesity; hyperlipidemia education not appropriate if related to medication as diet is unable to reverse these effects.   Pt reports that he really enjoys the food at [facility]. He states that he eats fruits and vegetables but has also been getting items such as fried fish and 2 servings of macaroni and cheese. Encouraged to chose protein sources such as baked meats/fish or yogurt and to get fruits and vegetables if he gets seconds.  He states that he would like to make changes in order to lose weight. He states that PCP had talked to him about weight loss injections; encouraged pt to make changes to diet as these would encourage a lifestyle change rather than a "quick fix."   Expect fair compliance.   Trenton Gammon, RD, LDN Inpatient Clinical Dietitian Pager # [phone] After hours/weekend pager # [phone]

## 2015-08-08 NOTE — ED Provider Notes (Signed)
CSN: [id number]     Arrival date & time 08/08/15  0110 History  This chart was scribed for Tomasita Crumble, MD by Lyndel Safe, ED Scribe. This patient was seen in room B17C/B17C and the patient's care was started 1:27 AM.   Chief Complaint  Patient presents with  . Chest Pain    The history is provided by the patient and the EMS personnel. No language interpreter was used.   HPI Comments: Edwin Henderson is a 54 y.o. male, with a PMhx of COPD, MI, schizophrenia, depression and bipolar disorder, brought in by ambulance from [facility], who presents to the Emergency Department complaining of sudden onset, non-radiating, constant, sharp, left-sided chest pain onset 1.5 hours ago that woke him up from sleep. The pt has a PMhx of MI 2 years ago that he was seen at [facility] for; states his current CP is worse than his last CP experienced with MI. Pt reports associated nausea. He was given  Zofran and  Aspirin en route. Pt still c/o CP. He denies vomiting or diaphoresis.   Past Medical History  Diagnosis Date  . Sleep apnea   . Schizophrenia   . Bipolar disorder   . Depression   . COPD (chronic obstructive pulmonary disease)   . Seizures   . Arthritis    History reviewed. No pertinent past surgical history. Family History  Problem Relation Age of Onset  . Diabetes Mother   . Mental illness Neg Hx   . Alcoholism Neg Hx    Social History  Substance Use Topics  . Smoking status: Never Smoker   . Smokeless tobacco: None  . Alcohol Use: No    Review of Systems 10 Systems reviewed and are negative for acute change except as noted in the HPI.  Allergies  Bee venom; Penicillins; A & d; Shellfish allergy; Tetracyclines & related; Codeine; and Tetracycline  Home Medications   Prior to Admission medications   Medication Sig Start Date End Date Taking? Authorizing Provider  carbamazepine (TEGRETOL) 200 MG tablet TAKE 2 AT NIGHT 02/27/15  Yes Historical Provider, MD  FLUoxetine  (PROZAC) 40 MG capsule Take 1 cap twice a day 12/13/14  Yes Historical Provider, MD  gabapentin (NEURONTIN) 300 MG capsule Take 1 cap 2 times during the day and 2 at night 02/27/15  Yes Historical Provider, MD  ibuprofen (ADVIL,MOTRIN) 800 MG tablet Take 800 mg by mouth. 05/02/15  Yes Historical Provider, MD  methylPREDNISolone (MEDROL DOSEPAK) 4 MG TBPK tablet follow package directions 08/02/15 08/09/15 Yes Historical Provider, MD  naproxen (NAPROSYN) 375 MG tablet Take 375 mg by mouth. 04/22/15 04/21/16 Yes Historical Provider, MD  oxyCODONE-acetaminophen (PERCOCET/ROXICET) 5-325 MG per tablet Take by mouth. 08/02/15 08/09/15 Yes Historical Provider, MD  tamsulosin (FLOMAX) 0.4 MG CAPS capsule Frequency:   Dosage:0   MG  Instructions:  Note:(flomax) take 1 a day 11/30/12  Yes Historical Provider, MD  topiramate (TOPAMAX) 100 MG tablet Take 1 tab 3 times a day 02/27/15  Yes Historical Provider, MD  albuterol (PROVENTIL HFA;VENTOLIN HFA) 108 (90 BASE) MCG/ACT inhaler Inhale 1 puff into the lungs every 4 (four) hours as needed for wheezing or shortness of breath (and coughing).    Historical Provider, MD  albuterol (PROVENTIL HFA;VENTOLIN HFA) 108 (90 BASE) MCG/ACT inhaler Inhale into the lungs.    Historical Provider, MD  amLODipine (NORVASC) 5 MG tablet Take 5 mg by mouth daily with breakfast.    Historical Provider, MD  amLODipine (NORVASC) 5 MG tablet take 1  tablet by oral route  every day    Historical Provider, MD  aspirin EC 81 MG tablet Take 81 mg by mouth daily with breakfast.    Historical Provider, MD  aspirin EC 81 MG tablet take 1 tablet by oral route  every day    Historical Provider, MD  balsalazide (COLAZAL) 750 MG capsule Take 2,250 mg by mouth 2 (two) times daily.    Historical Provider, MD  balsalazide (COLAZAL) 750 MG capsule Take 2,250 mg by mouth.    Historical Provider, MD  beclomethasone (QVAR) 80 MCG/ACT inhaler inhale 2 puff by inhalation route 2 times every day    Historical Provider, MD   benzonatate (TESSALON) 100 MG capsule Take 100 mg by mouth 3 (three) times daily as needed for cough.    Historical Provider, MD  benzonatate (TESSALON) 100 MG capsule Take 100 mg by mouth.    Historical Provider, MD  calcium acetate (PHOSLO) 667 MG capsule Take 1,334 mg by mouth daily with breakfast.    Historical Provider, MD  calcium acetate (PHOSLO) 667 MG capsule Take 667 mg by mouth.    Historical Provider, MD  carbamazepine (TEGRETOL) 200 MG tablet Take 400 mg by mouth at bedtime.    Historical Provider, MD  clobetasol (TEMOVATE) 0.05 % GEL apply by topical route 2 times every day a thin layer to the affected area(s)    Historical Provider, MD  colestipol (COLESTID) 1 G tablet Take 1 g by mouth 2 (two) times daily as needed (for diarrhea).    Historical Provider, MD  colestipol (COLESTID) 5 G granules Take by mouth.    Historical Provider, MD  cyclobenzaprine (FLEXERIL) 10 MG tablet Take 10 mg by mouth 3 (three) times daily as needed (for pain).    Historical Provider, MD  cyclobenzaprine (FLEXERIL) 10 MG tablet Take 10 mg by mouth.    Historical Provider, MD  fluocinonide cream (LIDEX) 0.05 % Apply 1 application topically 2 (two) times daily as needed (for rash).    Historical Provider, MD  FLUoxetine (PROZAC) 40 MG capsule Take 40 mg by mouth 2 (two) times daily.    Historical Provider, MD  fluticasone (FLONASE) 50 MCG/ACT nasal spray Place 1 spray into both nostrils daily.    Historical Provider, MD  fluticasone (FLONASE) 50 MCG/ACT nasal spray 1 spray by Each Nare route daily.    Historical Provider, MD  fluticasone (FLOVENT HFA) 110 MCG/ACT inhaler Inhale 2 puffs into the lungs 2 (two) times daily.    Historical Provider, MD  fluticasone (FLOVENT HFA) 110 MCG/ACT inhaler Inhale into the lungs.    Historical Provider, MD  gabapentin (NEURONTIN) 300 MG capsule Take 300-600 mg by mouth 3 (three) times daily. Takes 300mg  at 0800 and 1600, then 600mg  at 2100    Historical Provider, MD   HYDROcodone-acetaminophen (NORCO/VICODIN) 5-325 MG per tablet Take 1 tablet by mouth 3 (three) times daily as needed for moderate pain.    Historical Provider, MD  ibuprofen (ADVIL,MOTRIN) 800 MG tablet Take 800 mg by mouth every 8 (eight) hours as needed (for pain).    Historical Provider, MD  ipratropium-albuterol (DUONEB) 0.5-2.5 (3) MG/3ML SOLN Take 3 mLs by nebulization 4 (four) times daily as needed (for shortness of breath).    Historical Provider, MD  lisinopril (PRINIVIL,ZESTRIL) 10 MG tablet Take 5 mg by mouth daily with breakfast.    Historical Provider, MD  lisinopril (PRINIVIL,ZESTRIL) 20 MG tablet Take 5 mg by mouth.    Historical Provider, MD  Lurasidone  HCl (LATUDA) 120 MG TABS Take 120 mg by mouth at bedtime.    Historical Provider, MD  Lurasidone HCl 120 MG TABS Take 120 mg by mouth.    Historical Provider, MD  mesalamine (LIALDA) 1.2 G EC tablet Take 1,200 mg by mouth.    Historical Provider, MD  montelukast (SINGULAIR) 10 MG tablet Take 10 mg by mouth daily with breakfast.    Historical Provider, MD  montelukast (SINGULAIR) 10 MG tablet take 1 tablet by oral route  every day in the evening    Historical Provider, MD  omeprazole (PRILOSEC) 20 MG capsule Take 20 mg by mouth daily with breakfast.    Historical Provider, MD  omeprazole (PRILOSEC) 20 MG capsule Take 20 mg by mouth.    Historical Provider, MD  oxyCODONE-acetaminophen (PERCOCET/ROXICET) 5-325 MG per tablet Take 1 tablet by mouth every 6 (six) hours as needed (for pain).    Historical Provider, MD  predniSONE (DELTASONE) 1 MG tablet Take 4-8 mg by mouth See admin instructions. Day 1-Takes 2 tablets at breakfast and bedtime, then 1 tablet for lunch and dinner Day 2-Takes 1 tablet at breakfast, lunch, dinner, and 2 tablets at bedtime Day 3-Takes 1 tablet breakfast, lunch, dinner, and bedtime  Day 4-Takes 1 tablet breakfast, lunch, and bedtime Day 5-Takes 1 tablet at breakfast and bedtime Day 6-Takes 1 tablet with  breakfast 08/03/15   Historical Provider, MD  promethazine (PHENERGAN) 25 MG tablet Take 25 mg by mouth every 4 (four) hours as needed for nausea.    Historical Provider, MD  Propylene Glycol (SYSTANE BALANCE) 0.6 % SOLN Place 1 drop into both eyes 4 (four) times daily.    Historical Provider, MD  psyllium (REGULOID) 0.52 G capsule Take 0.52 g by mouth daily with breakfast.    Historical Provider, MD  Psyllium (REGULOID) 48.57 % POWD     Historical Provider, MD  senna (SENOKOT) 8.6 MG tablet Take by mouth.    Historical Provider, MD  senna-docusate (SENEXON-S) 8.6-50 MG per tablet Take 2 tablets by mouth at bedtime as needed for mild constipation.    Historical Provider, MD  tamsulosin (FLOMAX) 0.4 MG CAPS capsule Take 0.4 mg by mouth at bedtime.    Historical Provider, MD  topiramate (TOPAMAX) 100 MG tablet Take 100 mg by mouth 3 (three) times daily.    Historical Provider, MD  traMADol (ULTRAM) 50 MG tablet Take 50 mg by mouth every 6 (six) hours as needed (for pain).    Historical Provider, MD  Vitamin D, Ergocalciferol, (DRISDOL) 50000 UNITS CAPS capsule Take 50,000 Units by mouth every Sunday.    Historical Provider, MD  Vitamin D, Ergocalciferol, (DRISDOL) 50000 UNITS CAPS capsule take 1 capsule by oral route  every week    Historical Provider, MD   BP 124/80 mmHg  Pulse 73  Temp(Src) 97.8 F (36.6 C) (Oral)  Resp 20  Ht 5\' 5"  (1.651 m)  Wt 340 lb (154.223 kg)  BMI 56.58 kg/m2  SpO2 100% Physical Exam  Constitutional: He is oriented to person, place, and time. Vital signs are normal. He appears well-developed and well-nourished.  Non-toxic appearance. He does not appear ill. No distress.  HENT:  Head: Normocephalic and atraumatic.  Nose: Nose normal.  Mouth/Throat: Oropharynx is clear and moist. No oropharyngeal exudate.  Eyes: Conjunctivae and EOM are normal. Pupils are equal, round, and reactive to light. No scleral icterus.  Neck: Normal range of motion. Neck supple. No tracheal  deviation, no edema, no erythema and normal  range of motion present. No thyroid mass and no thyromegaly present.  Cardiovascular: Normal rate, regular rhythm, S1 normal, S2 normal, normal heart sounds, intact distal pulses and normal pulses.  Exam reveals no gallop and no friction rub.   No murmur heard. Pulses:      Radial pulses are 2+ on the right side, and 2+ on the left side.       Dorsalis pedis pulses are 2+ on the right side, and 2+ on the left side.  Pulmonary/Chest: Effort normal and breath sounds normal. No respiratory distress. He has no wheezes. He has no rhonchi. He has no rales.  Abdominal: Soft. Normal appearance and bowel sounds are normal. He exhibits no distension, no ascites and no mass. There is no hepatosplenomegaly. There is no tenderness. There is no rebound, no guarding and no CVA tenderness.  Musculoskeletal: Normal range of motion. He exhibits no edema or tenderness.  Lymphadenopathy:    He has no cervical adenopathy.  Neurological: He is alert and oriented to person, place, and time. He has normal strength. No cranial nerve deficit or sensory deficit.  Skin: Skin is warm, dry and intact. No petechiae and no rash noted. He is not diaphoretic. No erythema. No pallor.  Psychiatric: He has a normal mood and affect. His behavior is normal. Judgment normal.  Nursing note and vitals reviewed.   ED Course  Procedures  DIAGNOSTIC STUDIES: Oxygen Saturation is 100% on RA, normal by my interpretation.    COORDINATION OF CARE: 1:28 AM Discussed treatment plan with pt. Pt acknowledges and agrees to plan.   Labs Review Labs Reviewed  LIPID PANEL - Abnormal; Notable for the following:    Triglycerides 183 (*)    LDL Cholesterol 113 (*)    All other components within normal limits  PROLACTIN - Abnormal; Notable for the following:    Prolactin 60.5 (*)    All other components within normal limits  BASIC METABOLIC PANEL - Abnormal; Notable for the following:    CO2 21 (*)     Glucose, Bld 112 (*)    Calcium 8.4 (*)    All other components within normal limits  CARBAMAZEPINE LEVEL, TOTAL  TSH  T4, FREE  CBC  HEMOGLOBIN A1C  TROPONIN I  Rosezena Sensor, ED    Imaging Review Dg Chest 2 View  08/08/2015   CLINICAL DATA:  54 year old male with chest pain  EXAM: CHEST  2 VIEW  COMPARISON:  None.  FINDINGS: Two views of the chest demonstrate clear lungs. No focal consolidation, pleural effusion, or pneumothorax. Top-normal cardiac size. There is mild prominence of the central vasculature may represent a degree of congestive changes.  IMPRESSION: No focal consolidation.   Electronically Signed   By: Elgie Collard M.D.   On: 08/08/2015 02:27   I have personally reviewed and evaluated these images and lab results as part of my medical decision-making.   EKG Interpretation   Date/Time:  Wednesday August 08 2015 01:25:58 EDT Ventricular Rate:  75 PR Interval:  171 QRS Duration: 143 QT Interval:  420 QTC Calculation: 469 R Axis:   94 Text Interpretation:  Sinus rhythm RBBB and LPFB No old tracing to compare  Confirmed by Erroll Luna [phone]) on 08/08/2015 1:38:46 AM      MDM   Final diagnoses:  None    Patient presents emergency department for atypical chest pain. He states it occurred in his sleep following down. There is no exertional component. He denies any associated vomiting  or diaphoresis. He is low risk for chest pain, will evaluate with the heart score and 2 sets of troponins.  Heart score is 2 for age and nonspecific EKG repolarization disturbance. He was given nitroglycerin tabs emergency department without any effect on his chest pain at all. He was subsequently ordered Tylenol for pain. His vital signs remain within his normal limits emergency department, he is not in any acute distress. Repeat troponin is negative as well, patient is now safe for transfer back to his behavioral health facility. Repeat EKG does not show any  changes.  I personally performed the services described in this documentation, which was scribed in my presence. The recorded information has been reviewed and is accurate.   Tomasita Crumble, MD 08/08/15 2039

## 2015-08-08 NOTE — Progress Notes (Signed)
D: Patient in the dayroom on approach.  Patient states he had a good day.  Patient states his goal for today was to use his wheelchair less.  Patient states he did walk a few steps today in the dayroom without it.  Patient did bathe tonight as well.  Patient states he is still passive SI but verbally contracts for safety.  Patient denies HI.  Patient states he had auditory hallucinations that tell him to pick at his skin.  Patient verbally contracts for safety and states he has not picked at his skin today. A: Staff to monitor Q 15 mins for safety.  Encouragement and support offered.  Scheduled medications administered per orders.  Assisted patient with bathing tonight. Patient had bandages change to bilateral forearms. R: Patient remains safe on the unit.  Patient attended group tonight.  Patient visible on the unit.  Patient taking administered medications.

## 2015-08-08 NOTE — Progress Notes (Signed)
D: Pt behavior pleasant on approach. He reports his goal today is to "get some rest" Pt reports he was at the hospital last night d/t chest pain. "I really think it was the voices." Pt reports passive SI, but it able to verbally contract for safety. He denies SI. Endorses auditory hallucinations "The voices tell me to pick at my skin." Pt c/o chronic right hip pain. Using wheelchair to assist with ambulation. Laughing and joking with nursing staff.  A:Special checks q 15 mins in place for safety. Medication administered per MD order (see eMAR). Encouragement and support provided. Fall precautions in place.  R: Safety maintained. Compliant with medication regimen. No falls. Will continue to monitor.

## 2015-08-08 NOTE — Progress Notes (Addendum)
[facility] MD Progress Note  08/08/2015 1:21 PM Edwin Henderson  MRN:  [id number]   Subjective:  Patient states " I was having chest pain last night and I did not sleep. I still have [place] asking me to hurt myself , I am trying my best to cope .'   Objective: Edwin Henderson is a 54 y.o. male suffering with paranoid schizophrenia and self-injurious behaviors and also following up with his ACT Team, psychotherapeutic services who called EMS on his behalf. Patient seen and chart reviewed.Discussed patient with treatment team.    Pt had an episode last night , when he had severe chest pain with radiation to his arm, and was send to ED for evaluation. Pt with hx of MI , 2 years ago , was cleared medically by ED and was send back to [facility]. Pt today seen in bed , appears to be depressed and anxious , continues to have anxiety about his chest pain. Pt avoids eye contact today , states that he continues to have AH - asking him to hurt self . Pt with self-inflicted wounds on his chest, arms, and legs.Wounds covered with dressing - dressing appears clean. Pt also appears disheveled , is malodorous , continues to have poor hygiene.  Per nursing - pt is psychotic , has several somatic complaints and continues to need a lot of support.     Pt  with multiple medical problems , is wheel chair bound due to degenerative arthritis . Pt also needs assistance walking to the bathroom , using a shower as well as feeding self at times. Pt reports that his [place] has given him 30 day notice and he has to go to a higher level nursing home.   Principal Problem: Schizophrenia Diagnosis:   Patient Active Problem List   Diagnosis Date Noted  . Hyperprolactinemia [E22.1] 08/08/2015  . HTN (hypertension) [I10] 08/06/2015  . Schizophrenia [F20.9] 08/04/2015  . Self-injurious behavior [F48.9]    Total Time spent with patient: 30 minutes   Past Medical History:  Past Medical History  Diagnosis Date  . Sleep apnea   .  Schizophrenia   . Bipolar disorder   . Depression   . COPD (chronic obstructive pulmonary disease)   . Seizures   . Arthritis    Family History:  Family History  Problem Relation Age of Onset  . Diabetes Mother   . Mental illness Neg Hx   . Alcoholism Neg Hx    Social History:  History  Alcohol Use No     History  Drug Use No    Social History   Social History  . Marital Status: Single    Spouse Name: N/A  . Number of Children: N/A  . Years of Education: N/A   Social History Main Topics  . Smoking status: Never Smoker   . Smokeless tobacco: None  . Alcohol Use: No  . Drug Use: No  . Sexual Activity: Not Asked   Other Topics Concern  . None   Social History Narrative   Additional History:    Sleep: Poor was send to ED last night for chest pain  Appetite:  Fair   Musculoskeletal: Strength & Muscle Tone: decreased Gait & Station: wheel chair bound Patient leans: N/A   Psychiatric Specialty Exam: Physical Exam  Skin:  Multiple superficial skin excoriations noted on his forearm    Review of Systems  Skin:       Has skin excoriation all over his forearm, chest  Psychiatric/Behavioral: Positive  for depression and hallucinations. The patient is nervous/anxious and has insomnia.   All other systems reviewed and are negative.   Blood pressure 126/72, pulse 80, temperature 98.2 F (36.8 C), temperature source Oral, resp. rate 20, height 5' 5"  (1.651 m), weight 154.223 kg (340 lb), SpO2 93 %.Body mass index is 56.58 kg/(m^2).  General Appearance: Bizarre, Disheveled, Guarded and Poor hygiene  Eye Contact::  Fair  Speech:  Clear and Coherent and Slow  Volume:  Decreased  Mood:  Anxious and Depressed  Affect:  Depressed  Thought Process:  Disorganized  Orientation:  Full (Time, Place, and Person)  Thought Content:  Delusions, Hallucinations: Auditory, Paranoid Ideation and Rumination  Suicidal Thoughts:  AH asking him to hurt self - he is trying his best  not to  Homicidal Thoughts:  No  Memory:  Immediate;   Fair Recent;   Fair Remote;   Poor  Judgement:  Impaired  Insight:  Shallow  Psychomotor Activity:  Psychomotor Retardation  Concentration:  Fair  Recall:  [place]: Fair  Akathisia:  Negative  Handed:  Right  AIMS (if indicated):     Assets:  Communication Skills Desire for Improvement Financial Resources/Insurance Housing Leisure Time Resilience  ADL's:  Intact  Cognition: WNL  Sleep:  Number of Hours: 7.25     Current Medications: Current Facility-Administered Medications  Medication Dose Route Frequency Provider Last Rate Last Dose  . acetaminophen (TYLENOL) tablet 650 mg  650 mg Oral Q6H PRN Harriet Butte, NP   650 mg at 08/06/15 2025  . albuterol (PROVENTIL HFA;VENTOLIN HFA) 108 (90 BASE) MCG/ACT inhaler 1 puff  1 puff Inhalation Q4H PRN Harriet Butte, NP      . alum & mag hydroxide-simeth (MAALOX/MYLANTA) 200-200-20 MG/5ML suspension 30 mL  30 mL Oral Q4H PRN Harriet Butte, NP      . amLODipine (NORVASC) tablet 5 mg  5 mg Oral Q breakfast Harriet Butte, NP   5 mg at 08/08/15 0804  . ARIPiprazole (ABILIFY) tablet 5 mg  5 mg Oral QHS Iceis Knab, MD      . balsalazide (COLAZAL) capsule 2,250 mg  2,250 mg Oral BID Harriet Butte, NP   2,250 mg at 08/08/15 1005  . calcium acetate (PHOSLO) capsule 1,334 mg  1,334 mg Oral Q breakfast Harriet Butte, NP   1,334 mg at 08/08/15 0805  . carbamazepine (TEGRETOL) tablet 400 mg  400 mg Oral QHS Harriet Butte, NP   400 mg at 08/07/15 2101  . FLUoxetine (PROZAC) capsule 80 mg  80 mg Oral Daily Ursula Alert, MD   80 mg at 08/08/15 0805  . fluticasone (FLONASE) 50 MCG/ACT nasal spray 1 spray  1 spray Each Nare Daily Harriet Butte, NP   1 spray at 08/08/15 0809  . fluticasone (FLOVENT HFA) 110 MCG/ACT inhaler 2 puff  2 puff Inhalation BID Harriet Butte, NP   2 puff at 08/08/15 0809  . gabapentin (NEURONTIN) capsule 300 mg  300 mg  Oral 2 times per day Harriet Butte, NP   300 mg at 08/08/15 0806  . gabapentin (NEURONTIN) capsule 600 mg  600 mg Oral QHS Harriet Butte, NP   600 mg at 08/07/15 2101  . levofloxacin (LEVAQUIN) tablet 500 mg  500 mg Oral Daily Ursula Alert, MD   500 mg at 08/08/15 0806  . lisinopril (PRINIVIL,ZESTRIL) tablet 5 mg  5 mg Oral Q breakfast Harriet Butte,  NP   5 mg at 08/08/15 0806  . magnesium hydroxide (MILK OF MAGNESIA) suspension 30 mL  30 mL Oral Daily PRN Harriet Butte, NP      . methocarbamol (ROBAXIN) tablet 750 mg  750 mg Oral TID Encarnacion Slates, NP   750 mg at 08/08/15 1301  . montelukast (SINGULAIR) tablet 10 mg  10 mg Oral Q breakfast Harriet Butte, NP   10 mg at 08/08/15 0806  . nitroGLYCERIN (NITROSTAT) SL tablet 0.4 mg  0.4 mg Sublingual Q5 min PRN Everlene Balls, MD   0.4 mg at 08/08/15 0245  . nystatin cream (MYCOSTATIN)   Topical BID Paulino Rily, RN      . Derrill Memo ON 08/09/2015] OLANZapine (ZYPREXA) tablet 2.5 mg  2.5 mg Oral BH-q7a Sidrah Harden, MD      . OLANZapine (ZYPREXA) tablet 2.5 mg  2.5 mg Oral QHS Ava Tangney, MD      . OLANZapine (ZYPREXA) tablet 5 mg  5 mg Oral Q lunch Areli Jowett, MD   5 mg at 08/08/15 1300  . OLANZapine zydis (ZYPREXA) disintegrating tablet 5 mg  5 mg Oral TID PRN Ursula Alert, MD   5 mg at 08/06/15 2025  . ondansetron (ZOFRAN-ODT) disintegrating tablet 4 mg  4 mg Oral Q8H PRN Niel Hummer, NP      . pantoprazole (PROTONIX) EC tablet 40 mg  40 mg Oral Daily Harriet Butte, NP   40 mg at 08/08/15 0807  . polymixin-bacitracin (POLYSPORIN) ointment   Topical BID Ursula Alert, MD      . polyvinyl alcohol (LIQUIFILM TEARS) 1.4 % ophthalmic solution 1 drop  1 drop Both Eyes QID Harriet Butte, NP   1 drop at 08/08/15 1301  . psyllium (HYDROCIL/METAMUCIL) packet 1 packet  1 packet Oral Q breakfast Harriet Butte, NP   1 packet at 08/08/15 [phone]  . tamsulosin (FLOMAX) capsule 0.4 mg  0.4 mg Oral QHS Harriet Butte, NP   0.4 mg at  08/07/15 2101  . topiramate (TOPAMAX) tablet 100 mg  100 mg Oral TID Harriet Butte, NP   100 mg at 08/08/15 1301  . traZODone (DESYREL) tablet 100 mg  100 mg Oral QHS Ursula Alert, MD      . Vitamin D (Ergocalciferol) (DRISDOL) capsule 50,000 Units  50,000 Units Oral Q Sun Harriet Butte, NP   50,000 Units at 08/05/15 1147    Lab Results:  Results for orders placed or performed during the hospital encounter of 08/04/15 (from the past 48 hour(s))  Hemoglobin A1c     Status: Abnormal   Collection Time: 08/07/15  6:41 AM  Result Value Ref Range   Hgb A1c MFr Bld 6.1 (H) 4.8 - 5.6 %    Comment: (NOTE)         Pre-diabetes: 5.7 - 6.4         Diabetes: >6.4         Glycemic control for adults with diabetes: <7.0    Mean Plasma Glucose 128 mg/dL    Comment: (NOTE) Performed At: [facility] [place], [place] [id number] Lindon Romp MD [id number] Performed at [facility]   Prolactin     Status: Abnormal   Collection Time: 08/07/15  6:41 AM  Result Value Ref Range   Prolactin 60.5 (H) 4.0 - 15.2 ng/mL    Comment: (NOTE) Performed At: [facility] [address] [place], [place] [id number] Darrel Hoover  F MD [id number] Performed at [facility]   CBC     Status: None   Collection Time: 08/08/15  1:44 AM  Result Value Ref Range   WBC 8.3 4.0 - 10.5 K/uL   RBC 4.64 4.22 - 5.81 MIL/uL   Hemoglobin 14.0 13.0 - 17.0 g/dL   HCT 43.8 39.0 - 52.0 %   MCV 94.4 78.0 - 100.0 fL   MCH 30.2 26.0 - 34.0 pg   MCHC 32.0 30.0 - 36.0 g/dL   RDW 14.0 11.5 - 15.5 %   Platelets 225 150 - 400 K/uL  Basic metabolic panel     Status: Abnormal   Collection Time: 08/08/15  1:44 AM  Result Value Ref Range   Sodium 137 135 - 145 mmol/L   Potassium 4.2 3.5 - 5.1 mmol/L   Chloride 109 101 - 111 mmol/L   CO2 21 (L) 22 - 32 mmol/L   Glucose, Bld 112 (H) 65 - 99 mg/dL   BUN 16 6 - 20 mg/dL   Creatinine, Ser 1.11 0.61 -  1.24 mg/dL   Calcium 8.4 (L) 8.9 - 10.3 mg/dL   GFR calc non Af Amer >60 >60 mL/min   GFR calc Af Amer >60 >60 mL/min    Comment: (NOTE) The eGFR has been calculated using the CKD EPI equation. This calculation has not been validated in all clinical situations. eGFR's persistently <60 mL/min signify possible Chronic Kidney Disease.    Anion gap 7 5 - 15  I-stat troponin, ED (not at [facility], [facility])     Status: None   Collection Time: 08/08/15  2:11 AM  Result Value Ref Range   Troponin i, poc 0.01 0.00 - 0.08 ng/mL   Comment 3            Comment: Due to the release kinetics of cTnI, a negative result within the first hours of the onset of symptoms does not rule out myocardial infarction with certainty. If myocardial infarction is still suspected, repeat the test at appropriate intervals.   Troponin I     Status: None   Collection Time: 08/08/15  5:04 AM  Result Value Ref Range   Troponin I <0.03 <0.031 ng/mL    Comment:        NO INDICATION OF MYOCARDIAL INJURY.     Physical Findings: AIMS: Facial and Oral Movements Muscles of Facial Expression: None, normal Lips and Perioral Area: None, normal Jaw: None, normal Tongue: None, normal,Extremity Movements Upper (arms, wrists, hands, fingers): None, normal Lower (legs, knees, ankles, toes): None, normal, Trunk Movements Neck, shoulders, hips: None, normal, Overall Severity Severity of abnormal movements (highest score from questions above): None, normal Incapacitation due to abnormal movements: None, normal Patient's awareness of abnormal movements (rate only patient's report): No Awareness, Dental Status Current problems with teeth and/or dentures?: No Does patient usually wear dentures?: No  CIWA:    COWS:      Assessment: Patient presented with psychotic symptoms, paranoid delusions, auditory hallucinations and self-injurious behaviors and unable to respond to outpatient medication management including ACT team. Pt  continues to be psychotic , as well as has sleep issues , will need treatment and support.    Treatment Plan Summary: Daily contact with patient to assess and evaluate symptoms and progress in treatment and Medication management   Treatment Plan/Recommendations:   Reviewed past medical records,treatment plan.  Will continue to cross taper Latuda with Zyprexa for  psychosis/mood lability.Will add Abilify 5 mg po at bedtime -  pt with elevated PL level. Will consider cross tapering Abilify with Zyprexa, if he tolerates it well. Will continue Prozac , but change dose to 80 mg po daily as a one time dose instead of BID for affective sx.Pt with sleep issues - unknown if prozac could be affecting his sleep - since he has been taking a bid dosing. Increase Trazodone to 100 mg po qhs for sleep. Will also continue his Tegretol 400 mg po qhs , Topamax 100 mg po tid - these were his home medications . Will continue Polymixin for wound care / dressing changes . Added Levaquin 500 mg po daily for wound , empirical for infection. Restarted home medications where indicated . Will continue to monitor vitals ,medication compliance and treatment side effects while patient is here.  Will monitor for medical issues as well as call consult as needed.  Reviewed labs ,TSH -wnl, Lipid panel - LDL -slightly high. Pt is also morbidly obese , with multiple medical problems -  Dietician consult - see note CSW will start working on disposition.  Patient to participate in therapeutic milieu .       Medical Decision Making:  Review of Psycho-Social Stressors (1), Review or order clinical lab tests (1), Established Problem, Worsening (2), Review of Last Therapy Session (1), Review or order medicine tests (1), Review of Medication Regimen & Side Effects (2) and Review of New Medication or Change in Dosage (2)     Valerio Pinard md 08/08/2015, 1:21 PM

## 2015-08-08 NOTE — Progress Notes (Addendum)
Pt complains of 10/10 sudden left-sided chest pain that was radiating to his left arm. Pt was awaken by the chest pain. Pt describes the chest pain as stabbing in nature. Pt was awaken from his sleep with the pain.  Pt reports the feeling of  " an elephant sitting on his chest".    Pt reports having SOB. Pt also reports having nausea with no emesis. Vitals 124/80 73HR 20R 100% R/A  Pt reports having a MI in 2013 EKG performed with abnormal results show to the P.A. for interpretation and further orders.

## 2015-08-08 NOTE — ED Notes (Signed)
Pt was at [facility] and woke up from a sleep w/ left sided sharp chest pain.  He reports that he was in [facility] b/c he was hearing "demonic voices" telling him to harm himself.  He states that at this time he feels safe and denies any immediate thoughts of harming himself.  He was given 324 asp prior to EMS arriving.  EMS gave him  of zofran, vitals were as: 117/72, pulse 73, 20 resp, 99% on room air.

## 2015-08-08 NOTE — Progress Notes (Signed)
Adult Psychoeducational Group Note  Date:  08/08/2015 Time:  8:20 PM  Group Topic/Focus:  Wrap-Up Group:   The focus of this group is to help patients review their daily goal of treatment and discuss progress on daily workbooks.  Participation Level:  Minimal  Participation Quality:  Appropriate  Affect:  Flat  Cognitive:  Appropriate  Insight: Appropriate  Engagement in Group:  Engaged  Modes of Intervention:  Discussion  Additional Comments:  Pt rated overall day a 1 out of 10 because he did not get much sleep from being up all night at the ED because he was experiencing chest pains. Pt reported that his medication changing was a positive thing that happened today.   Cleotilde Neer 08/08/2015, 8:25 PM

## 2015-08-08 NOTE — Plan of Care (Signed)
Problem: Diagnosis: Increased Risk For Suicide Attempt Goal: STG-Patient Will Attend All Groups On The Unit Outcome: Progressing Patient attended group on the unit tonight.     

## 2015-08-08 NOTE — Plan of Care (Signed)
Problem: Alteration in mood Goal: STG-Patient is able to discuss feelings and issues (Patient is able to discuss feelings and issues leading to depression)  Outcome: Progressing Pt willingly discusses feelings and issues with nursing staff.

## 2015-08-08 NOTE — Progress Notes (Signed)
Pt transferred to [facility] via EMS. Maximiano Coss, RN notified of pt's transfer to facility for evaluation of chest pain.  Pt is not wishing for writer to contact any family member in regards to this matter.

## 2015-08-09 MED ORDER — TRAZODONE HCL 150 MG PO TABS
150.0000 mg | ORAL_TABLET | Freq: Every day | ORAL | Status: DC
  Administered 2015-08-09 – 2015-08-12 (×4): 150 mg via ORAL
  Filled 2015-08-09 (×6): qty 1

## 2015-08-09 MED ORDER — ARIPIPRAZOLE 10 MG PO TABS
10.0000 mg | ORAL_TABLET | Freq: Every day | ORAL | Status: DC
  Administered 2015-08-09: 10 mg via ORAL
  Filled 2015-08-09 (×3): qty 1

## 2015-08-09 NOTE — Progress Notes (Signed)
[facility] MD Progress Note  08/09/2015 11:38 AM Edwin Henderson  MRN:  [id number]   Subjective:  Patient states " I still have AH and have SI , but I am trying to cope.'   Objective: Edwin Henderson is a 54 y.o. male suffering with paranoid schizophrenia and self-injurious behaviors and also following up with his ACT Team, psychotherapeutic services who called EMS on his behalf.  Patient seen and chart reviewed.Discussed patient with treatment team.  Pt today seen more on the unit. Pt was seen in day room and wanted to talk to writer there. Pt today reports AH as improving , but they still are command , and asks him to hurt self , by digging his nails in to his skin. Pt with self-inflicted wounds on his chest, arms, and legs.Wounds covered with dressing - dressing appears clean.  Pt also with sleep issues , reports sleep as very restless last night.   Pt per nursing continues to have passive SI, has behavioral problems , sexually inappropriate gestures , impulsivity.    Pt  with multiple medical problems , is wheel chair bound due to degenerative arthritis . Pt also needs assistance walking to the bathroom , using a shower as well as feeding self at times. Pt reports that his [place] has given him 30 day notice and he has to go to a higher level nursing home.   Principal Problem: Schizophrenia Diagnosis:   Patient Active Problem List   Diagnosis Date Noted  . Hyperprolactinemia [E22.1] 08/08/2015  . HTN (hypertension) [I10] 08/06/2015  . Schizophrenia [F20.9] 08/04/2015  . Self-injurious behavior [F48.9]    Total Time spent with patient: 30 minutes   Past Medical History:  Past Medical History  Diagnosis Date  . Sleep apnea   . Schizophrenia   . Bipolar disorder   . Depression   . COPD (chronic obstructive pulmonary disease)   . Seizures   . Arthritis    Family History:  Family History  Problem Relation Age of Onset  . Diabetes Mother   . Mental illness Neg Hx   . Alcoholism Neg  Hx    Social History:  History  Alcohol Use No     History  Drug Use No    Social History   Social History  . Marital Status: Single    Spouse Name: N/A  . Number of Children: N/A  . Years of Education: N/A   Social History Main Topics  . Smoking status: Never Smoker   . Smokeless tobacco: None  . Alcohol Use: No  . Drug Use: No  . Sexual Activity: Not Asked   Other Topics Concern  . None   Social History Narrative   Additional History:    Sleep: Poor   Appetite:  Fair   Musculoskeletal: Strength & Muscle Tone: decreased Gait & Station: wheel chair bound Patient leans: N/A   Psychiatric Specialty Exam: Physical Exam  Skin:  Multiple superficial skin excoriations noted on his forearm    Review of Systems  Skin:       Has skin excoriation all over his forearm, chest  Psychiatric/Behavioral: Positive for depression and hallucinations. The patient is nervous/anxious and has insomnia.   All other systems reviewed and are negative.   Blood pressure 105/82, pulse 81, temperature 98.2 F (36.8 C), temperature source Oral, resp. rate 18, height 5' 5"  (1.651 m), weight 154.223 kg (340 lb), SpO2 93 %.Body mass index is 56.58 kg/(m^2).  General Appearance: Disheveled and Guarded  Eye Contact::  Fair  Speech:  Clear and Coherent and Slow  Volume:  Decreased  Mood:  Anxious and Depressed  Affect:  Depressed  Thought Process:  Disorganized improving  Orientation:  Full (Time, Place, and Person)  Thought Content:  Delusions, Hallucinations: Auditory, Paranoid Ideation and Rumination  Suicidal Thoughts:  AH asking him to hurt self - he is trying his best not to improving  Homicidal Thoughts:  No  Memory:  Immediate;   Fair Recent;   Fair Remote;   Poor  Judgement:  Impaired  Insight:  Shallow  Psychomotor Activity:  Psychomotor Retardation  Concentration:  Fair  Recall:  [place]: Fair  Akathisia:  Negative  Handed:  Right   AIMS (if indicated):     Assets:  Communication Skills Desire for Improvement Financial Resources/Insurance Housing Leisure Time Resilience  ADL's:  Intact  Cognition: WNL  Sleep:  Number of Hours: 6.75     Current Medications: Current Facility-Administered Medications  Medication Dose Route Frequency Provider Last Rate Last Dose  . acetaminophen (TYLENOL) tablet 650 mg  650 mg Oral Q6H PRN Harriet Butte, NP   650 mg at 08/06/15 2025  . albuterol (PROVENTIL HFA;VENTOLIN HFA) 108 (90 BASE) MCG/ACT inhaler 1 puff  1 puff Inhalation Q4H PRN Harriet Butte, NP      . alum & mag hydroxide-simeth (MAALOX/MYLANTA) 200-200-20 MG/5ML suspension 30 mL  30 mL Oral Q4H PRN Harriet Butte, NP      . amLODipine (NORVASC) tablet 5 mg  5 mg Oral Q breakfast Harriet Butte, NP   5 mg at 08/09/15 0806  . ARIPiprazole (ABILIFY) tablet 10 mg  10 mg Oral QHS Jim Lundin, MD      . balsalazide (COLAZAL) capsule 2,250 mg  2,250 mg Oral BID Harriet Butte, NP   2,250 mg at 08/09/15 0808  . calcium acetate (PHOSLO) capsule 1,334 mg  1,334 mg Oral Q breakfast Harriet Butte, NP   1,334 mg at 08/09/15 0807  . carbamazepine (TEGRETOL) tablet 400 mg  400 mg Oral QHS Harriet Butte, NP   400 mg at 08/08/15 2120  . FLUoxetine (PROZAC) capsule 80 mg  80 mg Oral Daily Ursula Alert, MD   80 mg at 08/09/15 0806  . fluticasone (FLONASE) 50 MCG/ACT nasal spray 1 spray  1 spray Each Nare Daily Harriet Butte, NP   1 spray at 08/09/15 0835  . fluticasone (FLOVENT HFA) 110 MCG/ACT inhaler 2 puff  2 puff Inhalation BID Harriet Butte, NP   2 puff at 08/09/15 0835  . gabapentin (NEURONTIN) capsule 300 mg  300 mg Oral 2 times per day Harriet Butte, NP   300 mg at 08/09/15 0807  . gabapentin (NEURONTIN) capsule 600 mg  600 mg Oral QHS Harriet Butte, NP   600 mg at 08/08/15 2120  . levofloxacin (LEVAQUIN) tablet 500 mg  500 mg Oral Daily Ursula Alert, MD   500 mg at 08/09/15 0808  . lisinopril  (PRINIVIL,ZESTRIL) tablet 5 mg  5 mg Oral Q breakfast Harriet Butte, NP   5 mg at 08/09/15 0807  . magnesium hydroxide (MILK OF MAGNESIA) suspension 30 mL  30 mL Oral Daily PRN Harriet Butte, NP      . methocarbamol (ROBAXIN) tablet 750 mg  750 mg Oral TID Encarnacion Slates, NP   750 mg at 08/09/15 0808  . montelukast (SINGULAIR) tablet 10 mg  10 mg Oral Q  breakfast Harriet Butte, NP   10 mg at 08/09/15 0806  . nitroGLYCERIN (NITROSTAT) SL tablet 0.4 mg  0.4 mg Sublingual Q5 min PRN Everlene Balls, MD   0.4 mg at 08/08/15 0245  . nystatin cream (MYCOSTATIN)   Topical BID Paulino Rily, RN      . OLANZapine (ZYPREXA) tablet 5 mg  5 mg Oral Q lunch Estiven Kohan, MD   5 mg at 08/08/15 1300  . OLANZapine zydis (ZYPREXA) disintegrating tablet 5 mg  5 mg Oral TID PRN Ursula Alert, MD   5 mg at 08/06/15 2025  . ondansetron (ZOFRAN-ODT) disintegrating tablet 4 mg  4 mg Oral Q8H PRN Niel Hummer, NP      . pantoprazole (PROTONIX) EC tablet 40 mg  40 mg Oral Daily Harriet Butte, NP   40 mg at 08/09/15 0807  . polymixin-bacitracin (POLYSPORIN) ointment   Topical BID Ursula Alert, MD      . polyvinyl alcohol (LIQUIFILM TEARS) 1.4 % ophthalmic solution 1 drop  1 drop Both Eyes QID Harriet Butte, NP   1 drop at 08/09/15 0836  . psyllium (HYDROCIL/METAMUCIL) packet 1 packet  1 packet Oral Q breakfast Harriet Butte, NP   1 packet at 08/09/15 [phone]  . tamsulosin (FLOMAX) capsule 0.4 mg  0.4 mg Oral QHS Harriet Butte, NP   0.4 mg at 08/08/15 2120  . topiramate (TOPAMAX) tablet 100 mg  100 mg Oral TID Harriet Butte, NP   100 mg at 08/09/15 0807  . traZODone (DESYREL) tablet 150 mg  150 mg Oral QHS Ursula Alert, MD      . Vitamin D (Ergocalciferol) (DRISDOL) capsule 50,000 Units  50,000 Units Oral Q Carla Drape, NP   50,000 Units at 08/05/15 1147    Lab Results:  Results for orders placed or performed during the hospital encounter of 08/04/15 (from the past 48 hour(s))  CBC     Status:  None   Collection Time: 08/08/15  1:44 AM  Result Value Ref Range   WBC 8.3 4.0 - 10.5 K/uL   RBC 4.64 4.22 - 5.81 MIL/uL   Hemoglobin 14.0 13.0 - 17.0 g/dL   HCT 43.8 39.0 - 52.0 %   MCV 94.4 78.0 - 100.0 fL   MCH 30.2 26.0 - 34.0 pg   MCHC 32.0 30.0 - 36.0 g/dL   RDW 14.0 11.5 - 15.5 %   Platelets 225 150 - 400 K/uL  Basic metabolic panel     Status: Abnormal   Collection Time: 08/08/15  1:44 AM  Result Value Ref Range   Sodium 137 135 - 145 mmol/L   Potassium 4.2 3.5 - 5.1 mmol/L   Chloride 109 101 - 111 mmol/L   CO2 21 (L) 22 - 32 mmol/L   Glucose, Bld 112 (H) 65 - 99 mg/dL   BUN 16 6 - 20 mg/dL   Creatinine, Ser 1.11 0.61 - 1.24 mg/dL   Calcium 8.4 (L) 8.9 - 10.3 mg/dL   GFR calc non Af Amer >60 >60 mL/min   GFR calc Af Amer >60 >60 mL/min    Comment: (NOTE) The eGFR has been calculated using the CKD EPI equation. This calculation has not been validated in all clinical situations. eGFR's persistently <60 mL/min signify possible Chronic Kidney Disease.    Anion gap 7 5 - 15  I-stat troponin, ED (not at [facility], [facility])     Status: None   Collection Time: 08/08/15  2:11  AM  Result Value Ref Range   Troponin i, poc 0.01 0.00 - 0.08 ng/mL   Comment 3            Comment: Due to the release kinetics of cTnI, a negative result within the first hours of the onset of symptoms does not rule out myocardial infarction with certainty. If myocardial infarction is still suspected, repeat the test at appropriate intervals.   Troponin I     Status: None   Collection Time: 08/08/15  5:04 AM  Result Value Ref Range   Troponin I <0.03 <0.031 ng/mL    Comment:        NO INDICATION OF MYOCARDIAL INJURY.     Physical Findings: AIMS: Facial and Oral Movements Muscles of Facial Expression: None, normal Lips and Perioral Area: None, normal Jaw: None, normal Tongue: None, normal,Extremity Movements Upper (arms, wrists, hands, fingers): None, normal Lower (legs, knees, ankles, toes):  None, normal, Trunk Movements Neck, shoulders, hips: None, normal, Overall Severity Severity of abnormal movements (highest score from questions above): None, normal Incapacitation due to abnormal movements: None, normal Patient's awareness of abnormal movements (rate only patient's report): No Awareness, Dental Status Current problems with teeth and/or dentures?: No Does patient usually wear dentures?: No  CIWA:    COWS:      Assessment: Patient presented with psychotic symptoms, paranoid delusions, auditory hallucinations and self-injurious behaviors and unable to respond to outpatient medication management including ACT team. Pt continues to be psychotic , , has SI , as well as has sleep issues , will need treatment and support.    Treatment Plan Summary: Daily contact with patient to assess and evaluate symptoms and progress in treatment and Medication management   Treatment Plan/Recommendations:   Reviewed past medical records,treatment plan.  Will cross taper Zyprexa with Abilify, due to his hyperprolactinemia . Pt would prefer to be on Abilify. Will increase Abilify tonight to 10 mg po qhs for psychosis. Will continue Prozac , but change dose to 80 mg po daily as a one time dose instead of BID for affective sx.Pt with sleep issues - unknown if prozac could be affecting his sleep - since he has been taking a bid dosing. Increase Trazodone to 150 mg po qhs for sleep. Will also continue his Tegretol 400 mg po qhs , Topamax 100 mg po tid - these were his home medications . Will continue Polymixin for wound care / dressing changes . Added Levaquin 500 mg po daily for wound , empirical for infection. Restarted home medications where indicated . Will continue to monitor vitals ,medication compliance and treatment side effects while patient is here.  Will monitor for medical issues as well as call consult as needed.  Reviewed labs ,TSH -wnl, Lipid panel - LDL -slightly high. Pt is also  morbidly obese , with multiple medical problems -  Dietician consult - see note CSW will start working on disposition.  Patient to participate in therapeutic milieu .       Medical Decision Making:  Review of Psycho-Social Stressors (1), Review or order clinical lab tests (1), Review of Last Therapy Session (1), Review or order medicine tests (1), Review of Medication Regimen & Side Effects (2) and Review of New Medication or Change in Dosage (2)     Tamas Suen md 08/09/2015, 11:38 AM

## 2015-08-09 NOTE — Progress Notes (Signed)
D: Patient in the hallway on approach.  Patient states he did not have a good day because he did not sleep well lastnight.  Patient has been using his wheelchair.  Patient states he has SI and auditory hallucinations that tell him to pick his skin.  Patient verbally contracts for safety.  Patient denies HI.  Patient states his goal for today was to get rid of the voices.  Patient states he did not meet his goal because the voices are still there.   A: Staff to monitor Q 15 mins for safety.  Encouragement and support offered.  Scheduled medications administered per orders. R: Patient remains safe on the unit.  Patient attended group tonight.  Patient visible on the unit.  Patient taking administered medications.

## 2015-08-09 NOTE — [facility] Suicide Risk Assessment (Addendum)
[facility] INPATIENT:  Family/Significant Other Suicide Prevention Education  Suicide Prevention Education:  Patient Discharged to Other Healthcare Facility:  Suicide Prevention Education Not Provided: Edwin Henderson is returning to [facility] in [employer] # [phone] The patient is discharging to another healthcare facility for continuation of treatment.  The patient's medical information, including suicide ideations and risk factors, are a part of the medical information shared with the receiving healthcare facility.  Edwin Henderson, Edwin Henderson B 08/09/2015, 4:14 PM

## 2015-08-09 NOTE — Progress Notes (Signed)
DAR Note:  Patient up and ambulatory with his walker.  Rates depression, hopelessness and anxiety at 10/10.  Reports auditory command hallucination telling him to "pick his skin."  Patient contracts for safety not to pick at his skin.  Dressing changed to both arms.  Medications given as prescribed.  Maintained on routine safety checks.  No signs and symptoms of acute respiratory distress noted.  Support and encouragement offered as needed.  Attended group therapy and participated.

## 2015-08-09 NOTE — Tx Team (Signed)
Interdisciplinary Treatment Plan Update (Adult)  Date:  08/09/2015   Time Reviewed:  4:12 PM   Progress in Treatment: Attending groups: Intermittently Participating in groups:  Intermittently Taking medication as prescribed:  Yes. Tolerating medication:  Yes. Family/Significant other contact made:  Yes Patient understands diagnosis:  Yes  As evidenced by seeking help with command hallucinations Discussing patient identified problems/goals with staff:  Yes, see initial care plan. Medical problems stabilized or resolved:  Yes. Denies suicidal/homicidal ideation: Yes. Issues/concerns per patient self-inventory:  No. Other:  New problem(s) identified:  Discharge Plan or Barriers: return to [facility], follow up with ACT team  Reason for Continuation of Hospitalization: Medication stabilization Other; describe command hallucinations  Comments:  " I am having AH of demons asking me to hurt myself by digging in to my skin. "   Objective: Edwin Henderson is a 54 y.o. male suffering with paranoid schizophrenia and self-injurious behaviors and also following up with his ACT Team, psychotherapeutic services who called EMS on his behalf. Patient seen and chart reviewed.Discussed patient with treatment team.  Pt continues to have demonic AH asking him to harm himself .Pt has self-inflicted wounds on his chest, arms, and legs. Pt states that his [name] has stopped working and does not help any more . Pt reports being tried on Risperidone in the past .He is not able to give detailed hx of medications he has used in the past , or what he is on now. Pt does report that he has not been tried on Zyprexa in the past. Pt reports that he also has multiple medical problems , is wheel chair bound due to degenerative arthritis . Pt also needs assistance walking to the bathroom , using a shower as well as feeding self at times. Pt reports that his [place] has given him 30 day notice and he has to go to a higher  level nursing home. Pt is also stressed out about this change.  Estimated length of stay: 3-4 days  New goal(s):  Review of initial/current patient goals per problem list:   Review of initial/current patient goals per problem list:  1. Goal(s): Patient will participate in aftercare plan   Met:    Target date: 3-5 days post admission date   As evidenced by: Patient will participate within aftercare plan AEB aftercare provider and housing plan at discharge being identified.  08/06/15:  Plans to return to [facility], follow up with [facility] ACT team     5. Goal(s): Patient will demonstrate decreased signs of psychosis  * Met: Yes  * Target date: 3-5 days post admission date  * As evidenced by: Patient will demonstrate decreased frequency of AVH or return to baseline function 08/06/15  Pt reports command hallucinations telling him to dig in his skin.  States his medication is no longer working. 08/09/15  Presentation much the same.  Has not been digging as his arms are covered with bandages.  Likely at baseline          Attendees: Patient:  08/09/2015 4:12 PM   Family:   08/09/2015 4:12 PM   Physician:  Ursula Alert, MD 08/09/2015 4:12 PM   Nursing:   Hedy Jacob , RN 08/09/2015 4:12 PM   CSW:    Roque Lias, LCSW   08/09/2015 4:12 PM   Other:  08/09/2015 4:12 PM   Other:   08/09/2015 4:12 PM   Other:  Lars Pinks, Nurse CM 08/09/2015 4:12 PM   Other:  Lucinda Dell, [facility] 08/09/2015  4:12 PM   Other:  Norberto Sorenson, [facility]  08/09/2015 4:12 PM   Other:  08/09/2015 4:12 PM   Other:  08/09/2015 4:12 PM   Other:  08/09/2015 4:12 PM   Other:  08/09/2015 4:12 PM   Other:  08/09/2015 4:12 PM   Other:   08/09/2015 4:12 PM    Scribe for Treatment Team:   Trish Mage, 08/09/2015 4:12 PM

## 2015-08-09 NOTE — [facility] Group Notes (Signed)
[facility] Group Notes:  (Nursing/MHT/Case Management/Adjunct)  Date:  08/09/2015  Time:  0930 Type of Therapy:  Psychoeducational Skills  Participation Level:  Active  Participation Quality:  Appropriate, Attentive and Sharing  Affect:  Appropriate  Cognitive:  Alert and Appropriate  Insight:  Appropriate and Good  Engagement in Group:  Engaged and Improving  Modes of Intervention:  Activity, Discussion, Education and Exploration  Summary of Progress/Problems: Patient attended group.  Participated and was receptive to therapy.  States goal for today is to stop the voices on his head.  Patient contracted for safety and not to harm self.  Mickie Bail 08/09/2015, 11:35 AM

## 2015-08-09 NOTE — [facility] Group Notes (Signed)
[facility] LCSW Group Therapy  08/09/2015 1:37 PM  Type of Therapy: Process Group Therapy  Participation Level: Active  Participation Quality: Appropriate  Affect: Flat  Cognitive: Oriented  Insight: Improving  Engagement in Group: Limited  Engagement in Therapy: Limited  Modes of Intervention: Activity, Clarification, Education, Problem-solving and Support  Summary of Progress/Problems: Today's group was on the topic of community.  Pt was alert and participated in group discussion. When other pt's were giving input pt did often redirect the conversation back to himself. However, pt was interested in the group and spoke about different forms of community that he is familiar with. Pt was upset by other pt's vocal outburst and spoke about how that pt should apologize to the group facilitator. Pt also became tearful when speaking about leaving his group home after 5 years and becoming older. Pt would like to find somewhere else to stay.  Vito Backers. Beverely Pace, MSW Intern 08/09/2015 1:37 PM

## 2015-08-09 NOTE — Progress Notes (Signed)
Patient went to [place] and sang a song.  Caroll Rancher, MHT

## 2015-08-10 MED ORDER — DOCUSATE SODIUM 100 MG PO CAPS
100.0000 mg | ORAL_CAPSULE | Freq: Two times a day (BID) | ORAL | Status: DC
  Administered 2015-08-10 – 2015-08-13 (×6): 100 mg via ORAL
  Filled 2015-08-10 (×11): qty 1

## 2015-08-10 MED ORDER — ARIPIPRAZOLE 15 MG PO TABS
15.0000 mg | ORAL_TABLET | Freq: Every day | ORAL | Status: DC
  Administered 2015-08-10 – 2015-08-12 (×3): 15 mg via ORAL
  Filled 2015-08-10 (×5): qty 1

## 2015-08-10 MED ORDER — PANTOPRAZOLE SODIUM 40 MG PO TBEC
40.0000 mg | DELAYED_RELEASE_TABLET | Freq: Two times a day (BID) | ORAL | Status: DC
  Administered 2015-08-10 – 2015-08-13 (×6): 40 mg via ORAL
  Filled 2015-08-10 (×10): qty 1

## 2015-08-10 MED ORDER — FAMOTIDINE 20 MG PO TABS: 20.0000 mg | ORAL_TABLET | Freq: Two times a day (BID) | ORAL | Status: DC

## 2015-08-10 NOTE — Progress Notes (Signed)
[facility] MD Progress Note  08/10/2015 12:35 PM Lyndle Pang  MRN:  [id number]   Subjective:  Patient states " I still have AH . I do not feel so good today since I threw up.'    Objective: Chad Donoghue is a 54 y.o. male suffering with paranoid schizophrenia and self-injurious behaviors and also following up with his ACT Team, psychotherapeutic services who called EMS on his behalf.  Patient seen and chart reviewed.Discussed patient with treatment team.  Pt today reports feeling worse due to having GI issues. Pt per nursing threw up . Pt does report vague abdominal pain , constipation. However denies any further nausea. Pt's VS remains wnl. Pt encouraged to make use of prn Zofran , take fluids and stay on a bland diet.  Pt otherwise , continues to have AH , asking him to hurt self by digging his nails in to his skin. Pt with self-inflicted wounds on his chest, arms, and legs.Wounds covered with dressing - dressing appears clean.  Pt also with sleep issues , reports sleep as very restless last night. Will readdress his sleep medication.   Pt per nursing continues to have passive SI, has behavioral problems , impulsivity on and off.    Pt  with multiple medical problems , is wheel chair bound due to degenerative arthritis . Pt also needs assistance walking to the bathroom , using a shower as well as feeding self at times. Pt reports that his GH has given him 30 day notice and he has to go to a higher level nursing home.   Principal Problem: Schizophrenia Diagnosis:   Patient Active Problem List   Diagnosis Date Noted  . Hyperprolactinemia [E22.1] 08/08/2015  . HTN (hypertension) [I10] 08/06/2015  . Schizophrenia [F20.9] 08/04/2015  . Self-injurious behavior [F48.9]    Total Time spent with patient: 30 minutes   Past Medical History:  Past Medical History  Diagnosis Date  . Sleep apnea   . Schizophrenia   . Bipolar disorder   . Depression   . COPD (chronic obstructive  pulmonary disease)   . Seizures   . Arthritis    Family History:  Family History  Problem Relation Age of Onset  . Diabetes Mother   . Mental illness Neg Hx   . Alcoholism Neg Hx    Social History:  History  Alcohol Use No     History  Drug Use No    Social History   Social History  . Marital Status: Single    Spouse Name: N/A  . Number of Children: N/A  . Years of Education: N/A   Social History Main Topics  . Smoking status: Never Smoker   . Smokeless tobacco: None  . Alcohol Use: No  . Drug Use: No  . Sexual Activity: Not Asked   Other Topics Concern  . None   Social History Narrative   Additional History:    Sleep: Poor   Appetite:  Fair   Musculoskeletal: Strength & Muscle Tone: decreased Gait & Station: wheel chair bound Patient leans: N/A   Psychiatric Specialty Exam: Physical Exam  Skin:  Multiple superficial skin excoriations noted on his forearm    Review of Systems  Gastrointestinal: Positive for vomiting and constipation.  Skin:       Has skin excoriation all over his forearm, chest  Psychiatric/Behavioral: Positive for depression and hallucinations. The patient is nervous/anxious and has insomnia.   All other systems reviewed and are negative.   Blood pressure 123/76, pulse 103,  temperature 98.2 F (36.8 C), temperature source Oral, resp. rate 18, height 5\' 5"  (1.651 m), weight 154.223 kg (340 lb), SpO2 93 %.Body mass index is 56.58 kg/(m^2).  General Appearance: Fairly Groomed and Guarded  Patent attorney::  Fair  Speech:  Clear and Coherent and Slow  Volume:  Decreased  Mood:  Anxious and Depressed  Affect:  Depressed  Thought Process:  Disorganized improving  Orientation:  Full (Time, Place, and Person)  Thought Content:  Delusions, Hallucinations: Auditory, Paranoid Ideation and Rumination  Suicidal Thoughts:  AH asking him to hurt self - he is trying his best not to improving  Homicidal Thoughts:  No  Memory:  Immediate;    Fair Recent;   Fair Remote;   Poor  Judgement:  Impaired  Insight:  Shallow  Psychomotor Activity:  Psychomotor Retardation  Concentration:  Fair  Recall:  [employer] of Knowledge:Fair  Language: Fair  Akathisia:  Negative  Handed:  Right  AIMS (if indicated):     Assets:  Communication Skills Desire for Improvement Financial Resources/Insurance Housing Leisure Time Resilience  ADL's:  Intact  Cognition: WNL  Sleep:  Number of Hours: 6.75     Current Medications: Current Facility-Administered Medications  Medication Dose Route Frequency Provider Last Rate Last Dose  . acetaminophen (TYLENOL) tablet 650 mg  650 mg Oral Q6H PRN Worthy Flank, NP   650 mg at 08/06/15 2025  . albuterol (PROVENTIL HFA;VENTOLIN HFA) 108 (90 BASE) MCG/ACT inhaler 1 puff  1 puff Inhalation Q4H PRN Worthy Flank, NP      . alum & mag hydroxide-simeth (MAALOX/MYLANTA) 200-200-20 MG/5ML suspension 30 mL  30 mL Oral Q4H PRN Worthy Flank, NP      . amLODipine (NORVASC) tablet 5 mg  5 mg Oral Q breakfast Worthy Flank, NP   5 mg at 08/10/15 0845  . ARIPiprazole (ABILIFY) tablet 15 mg  15 mg Oral QHS Saramma Eappen, MD      . balsalazide (COLAZAL) capsule 2,250 mg  2,250 mg Oral BID Worthy Flank, NP   2,250 mg at 08/10/15 0847  . calcium acetate (PHOSLO) capsule 1,334 mg  1,334 mg Oral Q breakfast Worthy Flank, NP   1,334 mg at 08/10/15 0847  . carbamazepine (TEGRETOL) tablet 400 mg  400 mg Oral QHS Worthy Flank, NP   400 mg at 08/09/15 2141  . docusate sodium (COLACE) capsule 100 mg  100 mg Oral BID Jomarie Longs, MD      . FLUoxetine (PROZAC) capsule 80 mg  80 mg Oral Daily Jomarie Longs, MD   80 mg at 08/10/15 0846  . fluticasone (FLONASE) 50 MCG/ACT nasal spray 1 spray  1 spray Each Nare Daily Worthy Flank, NP   1 spray at 08/10/15 0800  . fluticasone (FLOVENT HFA) 110 MCG/ACT inhaler 2 puff  2 puff Inhalation BID Worthy Flank, NP   2 puff at 08/10/15 0850  . gabapentin  (NEURONTIN) capsule 300 mg  300 mg Oral 2 times per day Worthy Flank, NP   300 mg at 08/10/15 0845  . gabapentin (NEURONTIN) capsule 600 mg  600 mg Oral QHS Worthy Flank, NP   600 mg at 08/09/15 2141  . levofloxacin (LEVAQUIN) tablet 500 mg  500 mg Oral Daily Jomarie Longs, MD   500 mg at 08/10/15 0847  . lisinopril (PRINIVIL,ZESTRIL) tablet 5 mg  5 mg Oral Q breakfast Worthy Flank, NP   5 mg at 08/10/15  0848  . magnesium hydroxide (MILK OF MAGNESIA) suspension 30 mL  30 mL Oral Daily PRN Worthy Flank, NP      . methocarbamol (ROBAXIN) tablet 750 mg  750 mg Oral TID Sanjuana Kava, NP   750 mg at 08/10/15 1147  . montelukast (SINGULAIR) tablet 10 mg  10 mg Oral Q breakfast Worthy Flank, NP   10 mg at 08/10/15 0847  . nitroGLYCERIN (NITROSTAT) SL tablet 0.4 mg  0.4 mg Sublingual Q5 min PRN Tomasita Crumble, MD   0.4 mg at 08/08/15 0245  . nystatin cream (MYCOSTATIN)   Topical BID Dione Housekeeper, RN      . OLANZapine zydis (ZYPREXA) disintegrating tablet 5 mg  5 mg Oral TID PRN Jomarie Longs, MD   5 mg at 08/06/15 2025  . ondansetron (ZOFRAN-ODT) disintegrating tablet 4 mg  4 mg Oral Q8H PRN Thermon Leyland, NP      . pantoprazole (PROTONIX) EC tablet 40 mg  40 mg Oral BID AC Saramma Eappen, MD      . polymixin-bacitracin (POLYSPORIN) ointment   Topical BID Jomarie Longs, MD      . polyvinyl alcohol (LIQUIFILM TEARS) 1.4 % ophthalmic solution 1 drop  1 drop Both Eyes QID Worthy Flank, NP   1 drop at 08/10/15 1149  . psyllium (HYDROCIL/METAMUCIL) packet 1 packet  1 packet Oral Q breakfast Worthy Flank, NP   1 packet at 08/10/15 [phone]  . tamsulosin (FLOMAX) capsule 0.4 mg  0.4 mg Oral QHS Worthy Flank, NP   0.4 mg at 08/09/15 2141  . topiramate (TOPAMAX) tablet 100 mg  100 mg Oral TID Worthy Flank, NP   100 mg at 08/10/15 1147  . traZODone (DESYREL) tablet 150 mg  150 mg Oral QHS Jomarie Longs, MD   150 mg at 08/09/15 2141  . Vitamin D (Ergocalciferol) (DRISDOL) capsule 50,000  Units  50,000 Units Oral Q Mitchell Heir, NP   50,000 Units at 08/05/15 1147    Lab Results:  No results found for this or any previous visit (from the past 48 hour(s)).  Physical Findings: AIMS: Facial and Oral Movements Muscles of Facial Expression: None, normal Lips and Perioral Area: None, normal Jaw: None, normal Tongue: None, normal,Extremity Movements Upper (arms, wrists, hands, fingers): None, normal Lower (legs, knees, ankles, toes): None, normal, Trunk Movements Neck, shoulders, hips: None, normal, Overall Severity Severity of abnormal movements (highest score from questions above): None, normal Incapacitation due to abnormal movements: None, normal Patient's awareness of abnormal movements (rate only patient's report): No Awareness, Dental Status Current problems with teeth and/or dentures?: No Does patient usually wear dentures?: No  CIWA:    COWS:      Assessment: Patient presented with psychotic symptoms, paranoid delusions, auditory hallucinations and self-injurious behaviors and unable to respond to outpatient medication management including ACT team. Pt today with GI issues , currently on symptomatic treatment with improvement . Pt continues to be psychotic , has SI , as well as has sleep issues , will need treatment and support.    Treatment Plan Summary: Daily contact with patient to assess and evaluate symptoms and progress in treatment and Medication management   Treatment Plan/Recommendations:   Reviewed past medical records,treatment plan.  Will cross taper Zyprexa with Abilify, due to his hyperprolactinemia . Pt would prefer to be on Abilify. Will increase Abilify tonight to 15 mg po qhs for psychosis. Will continue Prozac , but change dose to  80 mg po daily as a one time dose instead of BID for affective sx.Pt with sleep issues - unknown if prozac could be affecting his sleep - since he has been taking a bid dosing. Increase Trazodone to 200 mg po  qhs for sleep. Will also continue his Tegretol 400 mg po qhs , Topamax 100 mg po tid - these were his home medications . Will continue Polymixin for wound care / dressing changes . Added Levaquin 500 mg po daily for wound , empirical for infection. Add Zofran 4 mg po prn for nausea. Increase Protonix to 40 mg  po bid for Gi sx. Restarted home medications where indicated . Will continue to monitor vitals ,medication compliance and treatment side effects while patient is here.  Will monitor for medical issues as well as call consult as needed.  Reviewed labs ,TSH -wnl, Lipid panel - LDL -slightly high. Pt is also morbidly obese , with multiple medical problems -  Dietician consult - see note CSW will start working on disposition.  Patient to participate in therapeutic milieu .       Medical Decision Making:  Review of Psycho-Social Stressors (1), Review of Last Therapy Session (1), Review of Medication Regimen & Side Effects (2) and Review of New Medication or Change in Dosage (2)     Eappen,Saramma md 08/10/2015, 12:35 PM

## 2015-08-10 NOTE — Progress Notes (Signed)
DAR Note: Patient stayed in his room most of this shift.  Mood and affect remain depressed.  Vomited after breakfast.  Denies pain but endorses hearing voices.  Contract not to pick at his skin.  No signs of acute respiratory distress noted.  Dressing to both arms changed.  No s/s of infection noted.  Medications given as prescribed.  Maintained on routine safety checks per protocol.  Support and encouragement offered as needed.  Patient continue to use his wheel chair on the unit and to the dining room.

## 2015-08-10 NOTE — Progress Notes (Signed)
D: Patient was in bed at the start of the shift.  Writer did have to go wake patient up to go talk to him.  Patient states he did not have a good day because he got sick after eating seconds during a meal.  Patient states he did not have a goal for today.  Patient states he is passive SI and verbally contracts for safety.  Patient denies HI.  Patient states he still hear auditory hallucinations telling him to pick at his skin but assures writer he will not pick at his skin. A: Staff to monitor Q 15 mins for safety.  Encouragement and support offered.  Scheduled medications administered per orders. R: Patient remains safe on the unit.  Patient did not attend group tonight.  Patient not visible on the unit.  Patient taking administered medications.

## 2015-08-10 NOTE — Progress Notes (Signed)
Edwin Henderson came back from supper reporting the was vomiting.  PRN Zofran given.

## 2015-08-10 NOTE — [facility] Group Notes (Signed)
[facility] LCSW Group Therapy   08/10/2015 2:01 PM  Type of Therapy: Group Therapy  Participation Level:  Active  Participation Quality:  Attentive  Affect:  Flat  Cognitive:  Oriented  Insight:  Limited  Engagement in Therapy:  Engaged  Modes of Intervention:  Discussion and Socialization  Summary of Progress/Problems: Chaplain was here to lead a group on themes of hope and/or courage.  Pt was alert and participated frequently throughout group. Pt became tearful when speaking about his upcoming transistion out of his group home and into a nursing home. Later in the group pt's mood neutralized and he was able to speak positively about friends and experiences from the past that he thinks fondly of.  Edwin Henderson 08/10/2015 2:01 PM

## 2015-08-10 NOTE — [facility] Group Notes (Signed)
[facility] LCSW Aftercare Discharge Planning Group Note   08/10/2015 11:07 AM  Participation Quality:  Invited. Chose not to attend.   Lynn B Bryant   

## 2015-08-10 NOTE — Progress Notes (Signed)
Did not attend group 

## 2015-08-11 DIAGNOSIS — F209 Schizophrenia, unspecified: Secondary | ICD-10-CM

## 2015-08-11 NOTE — Progress Notes (Signed)
[facility] Group Notes:  (Nursing/MHT/Case Management/Adjunct)  Date:  08/11/2015  Time:  8:57 PM  Type of Therapy:  Psychoeducational Skills  Participation Level:  Active  Participation Quality:  Appropriate  Affect:  Depressed  Cognitive:  Appropriate  Insight:  Good  Engagement in Group:  Engaged  Modes of Intervention:  Education  Summary of Progress/Problems: The patient shared with the group that his day was "not good" overall. He states that he is still dealing with "manic depression" and that he often times feels "low" in terms of his mood. The patient also expressed in the group that he is unhappy about the prospect of being sent to a nursing home once he is discharged from the hospital. Finally, the patient verbalized in group that he was just recently started on a new medication and that he is waiting for it to be affective (Abilify). He was unable to come up with a coping skill.   Hazle Coca S 08/11/2015, 8:57 PM

## 2015-08-11 NOTE — Progress Notes (Signed)
Writer observed patient sitting in the dayroom watching the football game before group. Writer spoke with him 1:1 and he c/o right hip pain and having had a bad day. He reports that he continues to have auditory hallucinations telling him to scratch at his arms. He reports being depressed because of his living situation. Writer praised him for being out of his room and around other patients and watching tv. He reports passive si and verbally contracts for safety, denies hi/visual hallucinations. Support given, safety maintained on unit with 15 min checks.

## 2015-08-11 NOTE — Progress Notes (Signed)
Patient ID: Derell Bruun, male   DOB: 09/05/1961, 54 y.o.   MRN: [id number] [facility] MD Progress Note  08/11/2015 10:41 AM Yanuel Tagg  MRN:  [id number]   Subjective:  Patient states " I still have AH . Mood is variable but less nausea"    Objective: Hue Frick is a 54 y.o. male suffering with paranoid schizophrenia and self-injurious behaviors and also following up with his ACT Team, psychotherapeutic services who called EMS on his behalf.  Patient seen and chart reviewed.Discussed patient with treatment team.  Pt today reports feeling  to have  GI issues inluding nausea. Zofran was encouraged. Less nausea today. Used his wheelchair to ambulate Cross taper off from zyprexa to abilify for concern of prolactin continues and abilify was increased yesterday. Tolerating it reasonable as of now.  Pt otherwise , continues to have AH , asking him to hurt self by digging his nails in to his skin. Remains guarded . Pt with self-inflicted wounds on his chest, arms, and legs. dressing appears clean.  Pt also with sleep issues , reports sleep as very restless last night. Will readdress his sleep medication.   Pt per nursing continues to have passive SI, has behavioral problems , impulsivity on and off. Otherwise calm today.    Pt  with multiple medical problems , is wheel chair bound due to degenerative arthritis . Pt also needs assistance walking to the bathroom , using a shower as well as feeding self at times. Pt reports that his GH has given him 30 day notice and he has to go to a higher level nursing home.   Principal Problem: Schizophrenia Diagnosis:   Patient Active Problem List   Diagnosis Date Noted  . Hyperprolactinemia [E22.1] 08/08/2015  . HTN (hypertension) [I10] 08/06/2015  . Schizophrenia [F20.9] 08/04/2015  . Self-injurious behavior [F48.9]    Total Time spent with patient: 30 minutes   Past Medical History:  Past Medical History  Diagnosis Date  . Sleep apnea   .  Schizophrenia   . Bipolar disorder   . Depression   . COPD (chronic obstructive pulmonary disease)   . Seizures   . Arthritis    Family History:  Family History  Problem Relation Age of Onset  . Diabetes Mother   . Mental illness Neg Hx   . Alcoholism Neg Hx    Social History:  History  Alcohol Use No     History  Drug Use No    Social History   Social History  . Marital Status: Single    Spouse Name: N/A  . Number of Children: N/A  . Years of Education: N/A   Social History Main Topics  . Smoking status: Never Smoker   . Smokeless tobacco: None  . Alcohol Use: No  . Drug Use: No  . Sexual Activity: Not Asked   Other Topics Concern  . None   Social History Narrative   Additional History:    Sleep: Poor   Appetite:  Fair   Musculoskeletal: Strength & Muscle Tone: decreased Gait & Station: wheel chair bound Patient leans: N/A   Psychiatric Specialty Exam: Physical Exam  Skin:  Multiple superficial skin excoriations noted on his forearm    Review of Systems  Gastrointestinal: Positive for vomiting and constipation.  Skin:       Has skin excoriation all over his forearm, chest  Psychiatric/Behavioral: Positive for depression and hallucinations. The patient is nervous/anxious and has insomnia.   All other systems reviewed and are  negative.   Blood pressure 126/47, pulse 74, temperature 97.4 F (36.3 C), temperature source Oral, resp. rate 20, height 5\' 5"  (1.651 m), weight 154.223 kg (340 lb), SpO2 93 %.Body mass index is 56.58 kg/(m^2).  General Appearance: Fairly Groomed and Guarded  Patent attorney::  Fair  Speech:  Clear and Coherent and Slow  Volume:  Decreased  Mood:  Anxious and Depressed  Affect:  Depressed  Thought Process:  Disorganized improving  Orientation:  Full (Time, Place, and Person)  Thought Content:  Delusions, Hallucinations: Auditory, Paranoid Ideation and Rumination  Suicidal Thoughts:  AH asking him to hurt self - he is trying  his best not to improving  Homicidal Thoughts:  No  Memory:  Immediate;   Fair Recent;   Fair Remote;   Poor  Judgement:  Impaired  Insight:  Shallow  Psychomotor Activity:  Psychomotor Retardation  Concentration:  Fair  Recall:  [employer] of Knowledge:Fair  Language: Fair  Akathisia:  Negative  Handed:  Right  AIMS (if indicated):     Assets:  Communication Skills Desire for Improvement Financial Resources/Insurance Housing Leisure Time Resilience  ADL's:  Intact  Cognition: WNL  Sleep:  Number of Hours: 6.75     Current Medications: Current Facility-Administered Medications  Medication Dose Route Frequency Provider Last Rate Last Dose  . acetaminophen (TYLENOL) tablet 650 mg  650 mg Oral Q6H PRN Worthy Flank, NP   650 mg at 08/06/15 2025  . albuterol (PROVENTIL HFA;VENTOLIN HFA) 108 (90 BASE) MCG/ACT inhaler 1 puff  1 puff Inhalation Q4H PRN Worthy Flank, NP      . alum & mag hydroxide-simeth (MAALOX/MYLANTA) 200-200-20 MG/5ML suspension 30 mL  30 mL Oral Q4H PRN Worthy Flank, NP      . amLODipine (NORVASC) tablet 5 mg  5 mg Oral Q breakfast Worthy Flank, NP   5 mg at 08/11/15 0940  . ARIPiprazole (ABILIFY) tablet 15 mg  15 mg Oral QHS Jomarie Longs, MD   15 mg at 08/10/15 2311  . balsalazide (COLAZAL) capsule 2,250 mg  2,250 mg Oral BID Worthy Flank, NP   2,250 mg at 08/11/15 0942  . calcium acetate (PHOSLO) capsule 1,334 mg  1,334 mg Oral Q breakfast Worthy Flank, NP   1,334 mg at 08/11/15 0940  . carbamazepine (TEGRETOL) tablet 400 mg  400 mg Oral QHS Worthy Flank, NP   400 mg at 08/10/15 2311  . docusate sodium (COLACE) capsule 100 mg  100 mg Oral BID Jomarie Longs, MD   100 mg at 08/11/15 0939  . FLUoxetine (PROZAC) capsule 80 mg  80 mg Oral Daily Jomarie Longs, MD   80 mg at 08/11/15 0941  . fluticasone (FLONASE) 50 MCG/ACT nasal spray 1 spray  1 spray Each Nare Daily Worthy Flank, NP   1 spray at 08/11/15 0956  . fluticasone (FLOVENT HFA)  110 MCG/ACT inhaler 2 puff  2 puff Inhalation BID Worthy Flank, NP   2 puff at 08/11/15 0946  . gabapentin (NEURONTIN) capsule 300 mg  300 mg Oral 2 times per day Worthy Flank, NP   300 mg at 08/11/15 0941  . gabapentin (NEURONTIN) capsule 600 mg  600 mg Oral QHS Worthy Flank, NP   600 mg at 08/10/15 2311  . levofloxacin (LEVAQUIN) tablet 500 mg  500 mg Oral Daily Jomarie Longs, MD   500 mg at 08/11/15 0941  . lisinopril (PRINIVIL,ZESTRIL) tablet 5 mg  5  mg Oral Q breakfast Worthy Flank, NP   5 mg at 08/11/15 0941  . magnesium hydroxide (MILK OF MAGNESIA) suspension 30 mL  30 mL Oral Daily PRN Worthy Flank, NP      . methocarbamol (ROBAXIN) tablet 750 mg  750 mg Oral TID Sanjuana Kava, NP   750 mg at 08/11/15 0940  . montelukast (SINGULAIR) tablet 10 mg  10 mg Oral Q breakfast Worthy Flank, NP   10 mg at 08/11/15 0939  . nitroGLYCERIN (NITROSTAT) SL tablet 0.4 mg  0.4 mg Sublingual Q5 min PRN Tomasita Crumble, MD   0.4 mg at 08/08/15 0245  . nystatin cream (MYCOSTATIN)   Topical BID Dione Housekeeper, RN      . OLANZapine zydis (ZYPREXA) disintegrating tablet 5 mg  5 mg Oral TID PRN Jomarie Longs, MD   5 mg at 08/06/15 2025  . ondansetron (ZOFRAN-ODT) disintegrating tablet 4 mg  4 mg Oral Q8H PRN Thermon Leyland, NP   4 mg at 08/10/15 1835  . pantoprazole (PROTONIX) EC tablet 40 mg  40 mg Oral BID AC Jomarie Longs, MD   40 mg at 08/11/15 8295  . polymixin-bacitracin (POLYSPORIN) ointment   Topical BID Jomarie Longs, MD      . polyvinyl alcohol (LIQUIFILM TEARS) 1.4 % ophthalmic solution 1 drop  1 drop Both Eyes QID Worthy Flank, NP   1 drop at 08/11/15 0946  . psyllium (HYDROCIL/METAMUCIL) packet 1 packet  1 packet Oral Q breakfast Worthy Flank, NP   1 packet at 08/11/15 [phone]  . tamsulosin (FLOMAX) capsule 0.4 mg  0.4 mg Oral QHS Worthy Flank, NP   0.4 mg at 08/10/15 2310  . topiramate (TOPAMAX) tablet 100 mg  100 mg Oral TID Worthy Flank, NP   100 mg at 08/11/15 0940  .  traZODone (DESYREL) tablet 150 mg  150 mg Oral QHS Jomarie Longs, MD   150 mg at 08/10/15 2312  . Vitamin D (Ergocalciferol) (DRISDOL) capsule 50,000 Units  50,000 Units Oral Q Mitchell Heir, NP   50,000 Units at 08/05/15 1147    Lab Results:  No results found for this or any previous visit (from the past 48 hour(s)).  Physical Findings: AIMS: Facial and Oral Movements Muscles of Facial Expression: None, normal Lips and Perioral Area: None, normal Jaw: None, normal Tongue: None, normal,Extremity Movements Upper (arms, wrists, hands, fingers): None, normal Lower (legs, knees, ankles, toes): None, normal, Trunk Movements Neck, shoulders, hips: None, normal, Overall Severity Severity of abnormal movements (highest score from questions above): None, normal Incapacitation due to abnormal movements: None, normal Patient's awareness of abnormal movements (rate only patient's report): No Awareness, Dental Status Current problems with teeth and/or dentures?: No Does patient usually wear dentures?: No  CIWA:    COWS:      Assessment: Patient presented with psychotic symptoms, paranoid delusions, auditory hallucinations and self-injurious behaviors and unable to respond to outpatient medication management including ACT team. Pt today with GI issues , currently on symptomatic treatment with improvement . Pt continues to be psychotic , has SI , as well as has sleep issues , will need treatment and support.    Treatment Plan Summary: Daily contact with patient to assess and evaluate symptoms and progress in treatment and Medication management   Treatment Plan/Recommendations:   Reviewed past medical records,treatment plan.  Continue Abilify tonight to 15 mg po qhs for psychosis while cross taper from zyprexa. Will continue  Prozac , but change dose to 80 mg po daily  Trazodone to 200 mg po qhs for sleep. Will also continue his Tegretol 400 mg po qhs , Topamax 100 mg po tid - these were  his home medications . Will continue Polymixin for wound care / dressing changes .  Levaquin 500 mg po daily for wound , empirical for infection.  Zofran 4 mg po prn for nausea.  Protonix to 40 mg  po bid for Gi sx. Restarted home medications where indicated . Will continue to monitor vitals ,medication compliance and treatment side effects while patient is here.  Will monitor for medical issues as well as call consult as needed.  Reviewed labs ,TSH -wnl, Lipid panel - LDL -slightly high. Pt is also morbidly obese , with multiple medical problems -  Dietician consult - see note CSW will start working on disposition.  Patient to participate in therapeutic milieu .       Medical Decision Making:  Review of Psycho-Social Stressors (1), Review of Last Therapy Session (1), Review of Medication Regimen & Side Effects (2) and Review of New Medication or Change in Dosage (2)     AKHTAR, NADEEM md 08/11/2015, 10:41 AM

## 2015-08-11 NOTE — Progress Notes (Signed)
DAR Note:  Patient up and ambulatory with his walker in his room.  Endorses auditory hallucination to pick at his skin.  Patient contract for safety not to pick at his skin.  Dressing changed to both arms.  Wound healing well.  Maintained on routine safety checks per protocol.  Medication given as prescribed.  No acute respiratory distress noted.  Used his wheelchair to ambulate to the dining room.  Support and encouragement offered as needed.

## 2015-08-11 NOTE — [facility] Group Notes (Signed)
[facility] Group Notes:  (Clinical Social Work)  08/11/2015  11:15-12:00PM  Summary of Progress/Problems:   The main focus of today's process group was to discuss patients' feelings related to being hospitalized, as well as the difference between "being" and "having" a mental health diagnosis.  It was agreed in general by the group that it would be preferable to avoid future hospitalizations, and we discussed means of doing that.  As a follow-up, problems with adhering to medication recommendations were discussed.  The patient expressed their primary feeling about being hospitalized is scared because he cannot take care of himself.  He had significant healthy participation in group with appropriate remarks and observations.  Type of Therapy:  Group Therapy - Process  Participation Level:  Active  Participation Quality:  Appropriate, Attentive and Sharing  Affect:  Blunted  Cognitive:  Alert, Appropriate and Oriented  Insight:  Developing/Improving  Engagement in Therapy:  Engaged  Modes of Intervention:  Exploration, Discussion  Ambrose Mantle, LCSW 08/11/2015, 1:02 PM

## 2015-08-12 DIAGNOSIS — F201 Disorganized schizophrenia: Secondary | ICD-10-CM | POA: Insufficient documentation

## 2015-08-12 NOTE — [facility] Group Notes (Signed)
[facility] Group Notes: (Clinical Social Work)   08/12/2015      Type of Therapy:  Group Therapy   Participation Level:  Did Not Attend despite MHT prompting   Ambrose Mantle, LCSW 08/12/2015, 12:16 PM

## 2015-08-12 NOTE — Progress Notes (Signed)
Patient ID: Edwin Henderson, male   DOB: [birth year], 54 y.o.   MRN: [id number] [facility] MD Progress Note  08/12/2015 10:39 AM Edwin Henderson  MRN:  [id number]   Subjective:  Patient states " I still have AH . Mood is variable, mostly stays in bed but less nausea"    Objective: Edwin Henderson is a 54 y.o. male suffering with paranoid schizophrenia and self-injurious behaviors and also following up with his ACT Team, psychotherapeutic services who called EMS on his behalf.  Patient seen and chart reviewed.Discussed patient with treatment team.  Pt today reports no significant nausea.  Used his wheelchair to ambulate Cross taper off from zyprexa to abilify for concern of prolactin continues and abilify was increased yesterday. Tolerating it reasonable as of now.  Pt otherwise , continues to have AH , asking him to hurt self by digging his nails in to his skin. Remains guarded . Pt with self-inflicted wounds on his chest, arms, and legs. dressing appears clean.  Pt also with sleep issues , reports sleep as very restless last night. Will readdress his sleep medication.   Pt per nursing continues to have passive SI, has behavioral problems , impulsivity on and off. Otherwise calm today.    Pt  with multiple medical problems , is wheel chair bound due to degenerative arthritis . Pt also needs assistance walking to the bathroom , using a shower as well as feeding self at times. Pt reports that his GH has given him 30 day notice and he has to go to a higher level nursing home.   Principal Problem: Schizophrenia Diagnosis:   Patient Active Problem List   Diagnosis Date Noted  . Hyperprolactinemia [E22.1] 08/08/2015  . HTN (hypertension) [I10] 08/06/2015  . Schizophrenia [F20.9] 08/04/2015  . Self-injurious behavior [F48.9]    Total Time spent with patient: 30 minutes   Past Medical History:  Past Medical History  Diagnosis Date  . Sleep apnea   . Schizophrenia   . Bipolar disorder   .  Depression   . COPD (chronic obstructive pulmonary disease)   . Seizures   . Arthritis    Family History:  Family History  Problem Relation Age of Onset  . Diabetes Mother   . Mental illness Neg Hx   . Alcoholism Neg Hx    Social History:  History  Alcohol Use No     History  Drug Use No    Social History   Social History  . Marital Status: Single    Spouse Name: N/A  . Number of Children: N/A  . Years of Education: N/A   Social History Main Topics  . Smoking status: Never Smoker   . Smokeless tobacco: None  . Alcohol Use: No  . Drug Use: No  . Sexual Activity: Not Asked   Other Topics Concern  . None   Social History Narrative   Additional History:    Sleep: Poor   Appetite:  Fair   Musculoskeletal: Strength & Muscle Tone: decreased Gait & Station: wheel chair bound Patient leans: N/A   Psychiatric Specialty Exam: Physical Exam  Skin:  Multiple superficial skin excoriations noted on his forearm    Review of Systems  Constitutional: Negative for fever.  Skin:       Has skin excoriation all over his forearm, chest  Psychiatric/Behavioral: Positive for depression and hallucinations. The patient is nervous/anxious.   All other systems reviewed and are negative.   Blood pressure 106/56, pulse 67, temperature 97.5 F (36.4  C), temperature source Oral, resp. rate 16, height  (1.651 m), weight 154.223 kg (340 lb), SpO2 93 %.Body mass index is 56.58 kg/(m^2).  General Appearance: Fairly Groomed and Guarded  Patent attorney::  Fair  Speech:  Clear and Coherent and Slow  Volume:  Decreased  Mood:  Anxious and Depressed  Affect:  Depressed  Thought Process:  Disorganized improving  Orientation:  Full (Time, Place, and Person)  Thought Content:  Delusions, Hallucinations: Auditory, Paranoid Ideation and Rumination  Suicidal Thoughts:  AH asking him to hurt self - he is trying his best not to improving  Homicidal Thoughts:  No  Memory:  Immediate;    Fair Recent;   Fair Remote;   Poor  Judgement:  Impaired  Insight:  Shallow  Psychomotor Activity:  Psychomotor Retardation  Concentration:  Fair  Recall:  [employer] of Knowledge:Fair  Language: Fair  Akathisia:  Negative  Handed:  Right  AIMS (if indicated):     Assets:  Communication Skills Desire for Improvement Financial Resources/Insurance Housing Leisure Time Resilience  ADL's:  Intact  Cognition: WNL  Sleep:  Number of Hours: 6.75     Current Medications: Current Facility-Administered Medications  Medication Dose Route Frequency Edwin Henderson Last Rate Last Dose  . acetaminophen (TYLENOL) tablet 650 mg  650 mg Oral Q6H PRN Worthy Flank, NP   650 mg at 08/11/15 1954  . albuterol (PROVENTIL HFA;VENTOLIN HFA) 108 (90 BASE) MCG/ACT inhaler 1 puff  1 puff Inhalation Q4H PRN Worthy Flank, NP      . alum & mag hydroxide-simeth (MAALOX/MYLANTA) 200-200-20 MG/5ML suspension 30 mL  30 mL Oral Q4H PRN Worthy Flank, NP      . amLODipine (NORVASC) tablet 5 mg  5 mg Oral Q breakfast Worthy Flank, NP   5 mg at 08/12/15 0827  . ARIPiprazole (ABILIFY) tablet 15 mg  15 mg Oral QHS Jomarie Longs, MD   15 mg at 08/11/15 2130  . balsalazide (COLAZAL) capsule 2,250 mg  2,250 mg Oral BID Worthy Flank, NP   2,250 mg at 08/12/15 0828  . calcium acetate (PHOSLO) capsule 1,334 mg  1,334 mg Oral Q breakfast Worthy Flank, NP   1,334 mg at 08/12/15 0827  . carbamazepine (TEGRETOL) tablet 400 mg  400 mg Oral QHS Worthy Flank, NP   400 mg at 08/11/15 2130  . docusate sodium (COLACE) capsule 100 mg  100 mg Oral BID Jomarie Longs, MD   100 mg at 08/12/15 0826  . FLUoxetine (PROZAC) capsule 80 mg  80 mg Oral Daily Jomarie Longs, MD   80 mg at 08/12/15 0826  . fluticasone (FLONASE) 50 MCG/ACT nasal spray 1 spray  1 spray Each Nare Daily Worthy Flank, NP   1 spray at 08/12/15 0828  . fluticasone (FLOVENT HFA) 110 MCG/ACT inhaler 2 puff  2 puff Inhalation BID Worthy Flank, NP   2  puff at 08/12/15 0829  . gabapentin (NEURONTIN) capsule 300 mg  300 mg Oral 2 times per day Worthy Flank, NP   300 mg at 08/12/15 0827  . gabapentin (NEURONTIN) capsule 600 mg  600 mg Oral QHS Worthy Flank, NP   600 mg at 08/11/15 2130  . lisinopril (PRINIVIL,ZESTRIL) tablet 5 mg  5 mg Oral Q breakfast Worthy Flank, NP   5 mg at 08/12/15 0828  . magnesium hydroxide (MILK OF MAGNESIA) suspension 30 mL  30 mL Oral Daily PRN Worthy Flank,  NP      . methocarbamol (ROBAXIN) tablet 750 mg  750 mg Oral TID Sanjuana Kava, NP   750 mg at 08/12/15 0826  . montelukast (SINGULAIR) tablet 10 mg  10 mg Oral Q breakfast Worthy Flank, NP   10 mg at 08/12/15 0826  . nitroGLYCERIN (NITROSTAT) SL tablet 0.4 mg  0.4 mg Sublingual Q5 min PRN Tomasita Crumble, MD   0.4 mg at 08/08/15 0245  . nystatin cream (MYCOSTATIN)   Topical BID Dione Housekeeper, RN   1 application at 08/12/15 0800  . OLANZapine zydis (ZYPREXA) disintegrating tablet 5 mg  5 mg Oral TID PRN Jomarie Longs, MD   5 mg at 08/06/15 2025  . ondansetron (ZOFRAN-ODT) disintegrating tablet 4 mg  4 mg Oral Q8H PRN Thermon Leyland, NP   4 mg at 08/10/15 1835  . pantoprazole (PROTONIX) EC tablet 40 mg  40 mg Oral BID AC Saramma Eappen, MD   40 mg at 08/12/15 0640  . polymixin-bacitracin (POLYSPORIN) ointment   Topical BID Jomarie Longs, MD   1 application at 08/12/15 0800  . polyvinyl alcohol (LIQUIFILM TEARS) 1.4 % ophthalmic solution 1 drop  1 drop Both Eyes QID Worthy Flank, NP   1 drop at 08/12/15 0834  . psyllium (HYDROCIL/METAMUCIL) packet 1 packet  1 packet Oral Q breakfast Worthy Flank, NP   1 packet at 08/12/15 0825  . tamsulosin (FLOMAX) capsule 0.4 mg  0.4 mg Oral QHS Worthy Flank, NP   0.4 mg at 08/11/15 2130  . topiramate (TOPAMAX) tablet 100 mg  100 mg Oral TID Worthy Flank, NP   100 mg at 08/12/15 0827  . traZODone (DESYREL) tablet 150 mg  150 mg Oral QHS Jomarie Longs, MD   150 mg at 08/11/15 2130  . Vitamin D  (Ergocalciferol) (DRISDOL) capsule 50,000 Units  50,000 Units Oral Q Mitchell Heir, NP   50,000 Units at 08/12/15 9604    Lab Results:  No results found for this or any previous visit (from the past 48 hour(s)).  Physical Findings: AIMS: Facial and Oral Movements Muscles of Facial Expression: None, normal Lips and Perioral Area: None, normal Jaw: None, normal Tongue: None, normal,Extremity Movements Upper (arms, wrists, hands, fingers): None, normal Lower (legs, knees, ankles, toes): None, normal, Trunk Movements Neck, shoulders, hips: None, normal, Overall Severity Severity of abnormal movements (highest score from questions above): None, normal Incapacitation due to abnormal movements: None, normal Patient's awareness of abnormal movements (rate only patient's report): No Awareness, Dental Status Current problems with teeth and/or dentures?: No Does patient usually wear dentures?: No  CIWA:    COWS:      Assessment: Patient presented with psychotic symptoms, paranoid delusions, auditory hallucinations and self-injurious behaviors and unable to respond to outpatient medication management including ACT team. Pt today with GI issues , currently on symptomatic treatment with improvement . Pt continues to be psychotic , has SI , as well as has sleep issues , will need treatment and support.    Treatment Plan Summary: Daily contact with patient to assess and evaluate symptoms and progress in treatment and Medication management   Treatment Plan/Recommendations:   Reviewed past medical records,treatment plan.  Continue Abilify tonight to 15 mg po qhs for psychosis while cross taper from zyprexa. Will continue Prozac , but change dose to 80 mg po daily  Trazodone to 200 mg po qhs for sleep. Will also continue his Tegretol 400 mg po qhs ,  Topamax 100 mg po tid - these were his home medications . Will continue Polymixin for wound care / dressing changes .  Levaquin 500 mg po  daily for wound , empirical for infection.  Zofran 4 mg po prn for nausea.  Protonix to 40 mg  po bid for Gi sx. Restarted home medications where indicated . Will continue to monitor vitals ,medication compliance and treatment side effects while patient is here.  Will monitor for medical issues as well as call consult as needed.  Reviewed labs ,TSH -wnl, Lipid panel - LDL -slightly high. Pt is also morbidly obese , with multiple medical problems -  Dietician consult - see note CSW will start working on disposition.  Patient to participate in therapeutic milieu .       Medical Decision Making:  Review of Psycho-Social Stressors (1), Review of Last Therapy Session (1), Review of Medication Regimen & Side Effects (2) and Review of New Medication or Change in Dosage (2)     AKHTAR, NADEEM md 08/12/2015, 10:39 AM

## 2015-08-12 NOTE — [facility] Group Notes (Signed)
[facility] Group Notes:  (Nursing/MHT/Case Management/Adjunct)  Date:  08/12/2015  Time:  12:58 PM  Type of Therapy:  Psychoeducational Skills  Participation Level:  Minimal  Participation Quality:  Attentive  Affect:  Blunted  Cognitive:  Alert  Insight:  Appropriate  Engagement in Group:  None  Modes of Intervention:  Discussion  Summary of Progress/Problems: pt. Did not join in on discussion and left group before it was completed  Edwin Henderson 08/12/2015, 12:58 PM

## 2015-08-12 NOTE — Progress Notes (Signed)
Patient ID: Edwin Henderson, male   DOB: Oct 26, 1961, 54 y.o.   MRN: [id number] D  --  Pt. Complains of chronic pain, is high fall risk  And has multi medications.  He appears mildly confused and states passive SI with no plan.  He contracts for safety and remains safe.  Pt. Ambulates to his bath room alone but otherwise is in his wheel chair.  He stays in dayroom or his own room equally.   He has minimal conversation with peers but  Gets along without issue.   Pt. Attends groups with minimal participation, but is alert and attentive. His goal for today is to deal with his chronic pain.  He takes his meds as asked and shows no adverse effects.   --- A ---  Support, safety cks and meds provided. --- R ---  Pt. Remains safe on unit

## 2015-08-12 NOTE — Progress Notes (Signed)
[facility] Group Notes:  (Nursing/MHT/Case Management/Adjunct)  Date:  08/12/2015  Time:  9:04 PM  Type of Therapy:  Psychoeducational Skills  Participation Level:  Minimal  Participation Quality:  Attentive  Affect:  Depressed and Flat  Cognitive:  Appropriate  Insight:  Appropriate  Engagement in Group:  Limited  Modes of Intervention:  Education  Summary of Progress/Problems: The patient mentioned the same issues as he raised in last evening's group. First of all, he is complaining of feeling depressed and that it is at the same level as yesterday. Secondly, he verbalized that he is still concerned about being sent to a nursing home following discharge. As a theme for the day, his support system will consist of the staff at the group home.   Hazle Coca S 08/12/2015, 9:04 PM

## 2015-08-13 MED ORDER — GABAPENTIN 300 MG PO CAPS: ORAL_CAPSULE | ORAL | Status: AC

## 2015-08-13 MED ORDER — OMEPRAZOLE 20 MG PO CPDR: 20.0000 mg | DELAYED_RELEASE_CAPSULE | Freq: Every day | ORAL | Status: AC

## 2015-08-13 MED ORDER — OXYCODONE-ACETAMINOPHEN 5-325 MG PO TABS: 1.0000 | ORAL_TABLET | Freq: Four times a day (QID) | ORAL | Status: AC | PRN

## 2015-08-13 MED ORDER — METHYLPREDNISOLONE 4 MG PO TBPK: ORAL_TABLET | ORAL | Status: AC

## 2015-08-13 MED ORDER — FLUTICASONE PROPIONATE 50 MCG/ACT NA SUSP: 1.0000 | Freq: Every day | NASAL | Status: AC

## 2015-08-13 MED ORDER — DOCUSATE SODIUM 100 MG PO CAPS: 100.0000 mg | ORAL_CAPSULE | Freq: Two times a day (BID) | ORAL | Status: AC

## 2015-08-13 MED ORDER — ALBUTEROL SULFATE HFA 108 (90 BASE) MCG/ACT IN AERS: 1.0000 | INHALATION_SPRAY | RESPIRATORY_TRACT | Status: AC | PRN

## 2015-08-13 MED ORDER — TAMSULOSIN HCL 0.4 MG PO CAPS: 0.4000 mg | ORAL_CAPSULE | Freq: Every day | ORAL | Status: AC

## 2015-08-13 MED ORDER — CARBAMAZEPINE 200 MG PO TABS: 400.0000 mg | ORAL_TABLET | Freq: Every day | ORAL | Status: AC

## 2015-08-13 MED ORDER — PSYLLIUM 0.52 G PO CAPS: 0.5200 g | ORAL_CAPSULE | Freq: Every day | ORAL | Status: AC

## 2015-08-13 MED ORDER — AMLODIPINE BESYLATE 5 MG PO TABS: 5.0000 mg | ORAL_TABLET | Freq: Every day | ORAL | Status: AC

## 2015-08-13 MED ORDER — ARIPIPRAZOLE 15 MG PO TABS: 15.0000 mg | ORAL_TABLET | Freq: Every day | ORAL | Status: AC

## 2015-08-13 MED ORDER — FLUTICASONE PROPIONATE HFA 110 MCG/ACT IN AERO: 2.0000 | INHALATION_SPRAY | Freq: Two times a day (BID) | RESPIRATORY_TRACT | Status: AC

## 2015-08-13 MED ORDER — CALCIUM ACETATE 667 MG PO CAPS: 1334.0000 mg | ORAL_CAPSULE | Freq: Every day | ORAL | Status: AC

## 2015-08-13 MED ORDER — OLANZAPINE 5 MG PO TBDP: 5.0000 mg | ORAL_TABLET | Freq: Three times a day (TID) | ORAL | Status: AC | PRN

## 2015-08-13 MED ORDER — VITAMIN D (ERGOCALCIFEROL) 1.25 MG (50000 UNIT) PO CAPS: 50000.0000 [IU] | ORAL_CAPSULE | ORAL | Status: AC

## 2015-08-13 MED ORDER — TRAZODONE HCL 150 MG PO TABS: 150.0000 mg | ORAL_TABLET | Freq: Every day | ORAL | Status: AC

## 2015-08-13 MED ORDER — POLYVINYL ALCOHOL 1.4 % OP SOLN: 1.0000 [drp] | Freq: Four times a day (QID) | OPHTHALMIC | Status: AC

## 2015-08-13 MED ORDER — MONTELUKAST SODIUM 10 MG PO TABS: 10.0000 mg | ORAL_TABLET | Freq: Every day | ORAL | Status: AC

## 2015-08-13 MED ORDER — BALSALAZIDE DISODIUM 750 MG PO CAPS: 2250.0000 mg | ORAL_CAPSULE | Freq: Two times a day (BID) | ORAL | Status: AC

## 2015-08-13 MED ORDER — FLUOXETINE HCL 40 MG PO CAPS: 80.0000 mg | ORAL_CAPSULE | Freq: Every day | ORAL | Status: AC

## 2015-08-13 MED ORDER — LISINOPRIL 10 MG PO TABS: 5.0000 mg | ORAL_TABLET | Freq: Every day | ORAL | Status: AC

## 2015-08-13 MED ORDER — TOPIRAMATE 100 MG PO TABS: 100.0000 mg | ORAL_TABLET | Freq: Three times a day (TID) | ORAL | Status: AC

## 2015-08-13 MED ORDER — NYSTATIN 100000 UNIT/GM EX CREA: TOPICAL_CREAM | Freq: Two times a day (BID) | CUTANEOUS | Status: AC

## 2015-08-13 MED ORDER — DOUBLE ANTIBIOTIC 500-10000 UNIT/GM EX OINT: 1.0000 "application " | TOPICAL_OINTMENT | Freq: Two times a day (BID) | CUTANEOUS | Status: AC

## 2015-08-13 NOTE — Tx Team (Signed)
Interdisciplinary Treatment Plan Update (Adult)  Date:  08/13/2015   Time Reviewed:  11:06 AM   Progress in Treatment: Attending groups: Intermittently Participating in groups:  Intermittently Taking medication as prescribed:  Yes. Tolerating medication:  Yes. Family/Significant other contact made:  Yes Patient understands diagnosis:  Yes  As evidenced by seeking help with command hallucinations Discussing patient identified problems/goals with staff:  Yes, see initial care plan. Medical problems stabilized or resolved:  Yes. Denies suicidal/homicidal ideation: Yes. Issues/concerns per patient self-inventory:  No. Other:  New problem(s) identified:  Discharge Plan or Barriers: return to [facility], follow up with ACT team  Reason for Continuation of Hospitalization:   Comments:  Edwin Henderson is a 54 y.o. male suffering with paranoid schizophrenia and self-injurious behaviors and also following up with his ACT Team, psychotherapeutic services who called EMS on his behalf. Patient seen and chart reviewed.Discussed patient with treatment team.  Pt continues to have demonic AH asking him to harm himself .Pt has self-inflicted wounds on his chest, arms, and legs. Pt states that his [name] has stopped working and does not help any more . Pt reports being tried on Risperidone in the past .He is not able to give detailed hx of medications he has used in the past , or what he is on now. Pt does report that he has not been tried on Zyprexa in the past. Pt reports that he also has multiple medical problems , is wheel chair bound due to degenerative arthritis . Pt also needs assistance walking to the bathroom , using a shower as well as feeding self at times. Pt reports that his [place] has given him 30 day notice and he has to go to a higher level nursing home. Pt is also stressed out about this change.  Estimated length of stay: D/C today  New goal(s):  Review of initial/current patient goals  per problem list:   Review of initial/current patient goals per problem list:  1. Goal(s): Patient will participate in aftercare plan   Met: Yes   Target date: 3-5 days post admission date   As evidenced by: Patient will participate within aftercare plan AEB aftercare provider and housing plan at discharge being identified.  08/06/15:  Plans to return to [facility], follow up with [facility] ACT team     5. Goal(s): Patient will demonstrate decreased signs of psychosis  * Met: Yes  * Target date: 3-5 days post admission date  * As evidenced by: Patient will demonstrate decreased frequency of AVH or return to baseline function 08/06/15  Pt reports command hallucinations telling him to dig in his skin.  States his medication is no longer working. 08/09/15  Presentation much the same.  Has not been digging as his arms are covered with bandages.  Likely at baseline          Attendees: Patient:  08/13/2015 11:06 AM   Family:   08/13/2015 11:06 AM   Physician:  Carlton Adam, MD 08/13/2015 11:06 AM   Nursing:   Hedy Jacob , RN 08/13/2015 11:06 AM   CSW:    Roque Lias, LCSW   08/13/2015 11:06 AM   Other:  08/13/2015 11:06 AM   Other:   08/13/2015 11:06 AM   Other:  Lars Pinks, Nurse CM 08/13/2015 11:06 AM   Other:  Lucinda Dell, [facility] 08/13/2015 11:06 AM   Other:  Norberto Sorenson, [place]  08/13/2015 11:06 AM   Other:  08/13/2015 11:06 AM   Other:  08/13/2015 11:06 AM  Other:  08/13/2015 11:06 AM   Other:  08/13/2015 11:06 AM   Other:  08/13/2015 11:06 AM   Other:   08/13/2015 11:06 AM    Scribe for Treatment Team:   Trish Mage, 08/13/2015 11:06 AM

## 2015-08-13 NOTE — Progress Notes (Signed)
Pt discharged home with prescriptions.  Pt was stable and appreciative at that time. Discharge instructions and prescriptions reviewed with patient.  All papers and prescriptions were given and valuables returned. Verbal understanding expressed. Denies SI/HI and A/VH. Maintained on routine safety checks until discharged.  Pt given opportunity to express concerns and ask questions.

## 2015-08-13 NOTE — [facility] Suicide Risk Assessment (Signed)
[facility] Discharge Suicide Risk Assessment   Demographic Factors:  Male  Total Time spent with patient: 30 minutes  Musculoskeletal: Strength & Muscle Tone: within normal limits Gait & Station: using a wheel chair Patient leans: N/A  Psychiatric Specialty Exam: Physical Exam  Review of Systems  Constitutional: Positive for malaise/fatigue.  HENT: Negative.   Eyes: Negative.   Respiratory: Positive for shortness of breath.   Cardiovascular: Negative.   Gastrointestinal: Negative.   Genitourinary: Negative.   Musculoskeletal: Positive for back pain.  Skin: Negative.   Neurological: Positive for weakness.  Endo/Heme/Allergies: Negative.   Psychiatric/Behavioral: Positive for depression.    Blood pressure 106/51, pulse 72, temperature 97.8 F (36.6 C), temperature source Oral, resp. rate 18, height  (1.651 m), weight 154.223 kg (340 lb), SpO2 93 %.Body mass index is 56.58 kg/(m^2).  General Appearance: Fairly Groomed  Patent attorney::  Fair  Speech:  Clear and Coherent409  Volume:  Decreased  Mood:  worried  Affect:  Restricted  Thought Process:  Coherent and Goal Directed  Orientation:  Full (Time, Place, and Person)  Thought Content:  plans as he moves on  Suicidal Thoughts:  No  Homicidal Thoughts:  No  Memory:  Immediate;   Fair Recent;   Fair Remote;   Fair  Judgement:  Fair  Insight:  Present  Psychomotor Activity:  Decreased  Concentration:  Fair  Recall:  [employer] of Knowledge:Fair  Language: Fair  Akathisia:  No  Handed:  Right  AIMS (if indicated):     Assets:  Desire for Improvement Housing Social Support  Sleep:  Number of Hours: 5.5  Cognition: WNL  ADL's:  Intact   Have you used any form of tobacco in the last 30 days? (Cigarettes, Smokeless Tobacco, Cigars, and/or Pipes): No  Has this patient used any form of tobacco in the last 30 days? (Cigarettes, Smokeless Tobacco, Cigars, and/or Pipes) No  Mental Status Per Nursing Assessment::   On  Admission:  Suicidal ideation indicated by patient, Self-harm thoughts, Self-harm behaviors  Current Mental Status by Physician: IN full contact with reality. There are no active SI plans or intent. He is going back to the assisted living but is aware that they are going to be seeking nursing home placement due to his medical needs   Loss Factors: Decline in physical health  Historical Factors: NA  Risk Reduction Factors:   Living with another person, especially a relative and Positive social support  Continued Clinical Symptoms:  Schizophrenia:   Paranoid or undifferentiated type  Cognitive Features That Contribute To Risk:  Closed-mindedness, Polarized thinking and Thought constriction (tunnel vision)    Suicide Risk:  Minimal: No identifiable suicidal ideation.  Patients presenting with no risk factors but with morbid ruminations; may be classified as minimal risk based on the severity of the depressive symptoms  Principal Problem: Schizophrenia Discharge Diagnoses:  Patient Active Problem List   Diagnosis Date Noted  . 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 Hyperprolactinemia [E22.1] 08/08/2015  . HTN (hypertension) [I10] 08/06/2015  . Schizophrenia [F20.9] 08/04/2015  . Self-injurious behavior [F48.9]     Follow-up Information    Follow up with [facility]    . Schedule an appointment as soon as possible for a visit in 3 days.   Why:  for close follow up of your chest pain   Contact information:   [address] [place] [address] [phone]      Follow up with  [facility].   Why:  Someone from the ACT team will be out to see you tomorrow.  Call them to find out what time.   Contact information:   [address]  [place]  [phone]      Plan Of Care/Follow-up recommendations:  Activity:  as tolerated Diet:  hart healthy Follow up [facility] as above Is patient on multiple antipsychotic therapies at discharge:   No   Has Patient had three or more failed trials of antipsychotic monotherapy by history:  No  Recommended Plan for Multiple Antipsychotic Therapies: NA    LUGO,IRVING A 08/13/2015, 1:18 PM

## 2015-08-13 NOTE — Progress Notes (Addendum)
D: Pt reports feeling "depressed" this evening. Pt's eye wanders to the floor as he describes his mood. Pt discusses the diminishing of his physical health and his need to be placed into a skilled nursing facility. Pt acknowledges the need to be transferred to higher level of a care from his current group home. Pt admits that he would be missing his friends (roommates) in the process. Pt reports having AH. Pt is passive for SI. Pt is able to verbally contract for safety.  A: Writer administered scheduled medications to pt, per MD orders. Continued support and availability as needed was extended to this pt. Staff continue to monitor pt with q24min checks.  R: No adverse drug reactions noted. Pt receptive to treatment. Pt remains safe at this time.

## 2015-08-13 NOTE — Progress Notes (Signed)
  [facility] Adult Case Management Discharge Plan :  Will you be returning to the same living situation after discharge:  Yes,  group home At discharge, do you have transportation home?: Yes,  group home Do you have the ability to pay for your medications: Yes,  MCD  Release of information consent forms completed and in the chart;  Patient's signature needed at discharge.  Patient to Follow up at: Follow-up Information    Follow up with [facility]    . Schedule an appointment as soon as possible for a visit in 3 days.   Why:  for close follow up of your chest pain   Contact information:   [address] [place] [address] [phone]      Follow up with [facility].   Why:  Someone from the ACT team will be out to see you tomorrow.  Call them to find out what time.   Contact information:   [address]  [place]  [phone]      Patient denies SI/HI: Yes,  yes    Safety Planning and Suicide Prevention discussed: Yes,  yes  Have you used any form of tobacco in the last 30 days? (Cigarettes, Smokeless Tobacco, Cigars, and/or Pipes): No  Has patient been referred to the Quitline?: N/A patient is not a smoker  Edwin Henderson, Edwin Henderson 08/13/2015, 11:11 AM

## 2015-08-13 NOTE — Discharge Summary (Signed)
Physician Discharge Summary Note  Patient:  Edwin Henderson is an 54 y.o., male MRN:  [id number] DOB:  1961/04/14 Patient phone:  [phone] (home)  Patient address:   [address] [place] [address],  Total Time spent with patient: 30 minutes  Date of Admission:  08/04/2015 Date of Discharge: 08/13/2015  Reason for Admission:  Acute psychosis  Principal Problem: Schizophrenia Discharge Diagnoses: Patient Active Problem List   Diagnosis Date Noted  . 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 Hyperprolactinemia [E22.1] 08/08/2015  . HTN (hypertension) [I10] 08/06/2015  . Schizophrenia [F20.9] 08/04/2015  . Self-injurious behavior [F48.9]    Musculoskeletal: Strength & Muscle Tone: within normal limits Gait & Station: using a wheel chair Patient leans: N/A  Psychiatric Specialty Exam: Physical Exam  Psychiatric: He has a normal mood and affect. His speech is normal and behavior is normal. Judgment and thought content normal. Cognition and memory are normal.    Review of Systems  Constitutional: Negative.   HENT: Negative.   Eyes: Negative.   Respiratory: Negative.   Cardiovascular: Negative.   Gastrointestinal: Negative.   Genitourinary: Negative.   Musculoskeletal: Negative.   Skin:       Patient has self-inflicted wounds from digging nails into skin over arms and abdomen. No current signs of infection.   Neurological: Negative.   Endo/Heme/Allergies: Negative.   Psychiatric/Behavioral: Positive for depression (Stabilized ) and hallucinations (Stabilized with current medication regimen). Negative for suicidal ideas, memory loss and substance abuse. The patient is not nervous/anxious and does not have insomnia.     Blood pressure 106/51, pulse 72, temperature 97.8 F (36.6 C), temperature source Oral, resp. rate 18, height 5\' 5"  (1.651 m), weight 154.223 kg (340 lb), SpO2 93 %.Body mass index is 56.58 kg/(m^2).       Have you used any form of tobacco in the last  30 days? (Cigarettes, Smokeless Tobacco, Cigars, and/or Pipes): No  Has this patient used any form of tobacco in the last 30 days? (Cigarettes, Smokeless Tobacco, Cigars, and/or Pipes) No  Past Medical History:  Past Medical History  Diagnosis Date  . Sleep apnea   . Schizophrenia   . Bipolar disorder   . Depression   . COPD (chronic obstructive pulmonary disease)   . Seizures   . Arthritis    History reviewed. No pertinent past surgical history. Family History:  Family History  Problem Relation Age of Onset  . Diabetes Mother   . Mental illness Neg Hx   . Alcoholism Neg Hx    Social History:  History  Alcohol Use No     History  Drug Use No    Social History   Social History  . Marital Status: Single    Spouse Name: N/A  . Number of Children: N/A  . Years of Education: N/A   Social History Main Topics  . Smoking status: Never Smoker   . Smokeless tobacco: None  . Alcohol Use: No  . Drug Use: No  . Sexual Activity: Not Asked   Other Topics Concern  . None   Social History Narrative   Risk to Self: Suicidal Ideation: No Suicidal Intent: No Is patient at risk for suicide?: No Suicidal Plan?: No Access to Means: No What has been your use of drugs/alcohol within the last 12 months?: None How many times?: 0 Other Self Harm Risks: na-pt denies Triggers for Past Attempts: None known Intentional Self Injurious Behavior: Damaging Comment - Self Injurious Behavior: picking skin until bleeds Risk to  Others: Homicidal Ideation: No Thoughts of Harm to Others: No Current Homicidal Intent: No Current Homicidal Plan: No Access to Homicidal Means: No Identified Victim: na-pt denies History of harm to others?: No Assessment of Violence: None Noted Violent Behavior Description: na-pt cooperative Does patient have access to weapons?: No Criminal Charges Pending?: No Does patient have a court date: No Prior Inpatient Therapy: Prior Inpatient Therapy: Yes Prior  Therapy Dates: [facility] Prior Therapy Facilty/Provider(s): 2009, 2015 Reason for Treatment: psychosis Prior Outpatient Therapy: Prior Outpatient Therapy: Yes Prior Therapy Dates: Current Prior Therapy Facilty/Provider(s): [facility], Dr. Sandria Manly in 2012 Reason for Treatment: Me dmgnt Does patient have an ACCT team?: Yes Does patient have Intensive In-House Services?  : Unknown Does patient have [facility] services? : No Does patient have P4CC services?: Unknown  Level of Care:  OP  Hospital Course:   Edwin Henderson is an 54 y.o. male that presents to [facility] via EMS. Pt stated his ACT Team, [facility], called EMS on his behalf. Pt stated he has been harming himself and hearing voices telling him to do so. Pt stated that he hears "demonic voices" telling him to "scratch and dig" at his arms. Pt stated that this has been going on for over a year, but has recently worsened. Pt stated, "I need help." Pt has self-inflicted wounds on his chest, arms, and legs, some covered with bandages. No delusions noted. Pt denies having visual hallucinations. Pt denies HI or SA. Pt endorses sx of depression. Pt stated he has a hx of diagnoses of Schizophrenia and Bipolar Disorder. Pt stated he got "sick" when his father passed in 2009. He stated he has been hospitalized twice in 2009 and 2015 at [facility] for psychosis. Pt stated he cannot recall his mental health or medical medications. Pt stated he lives in a group home, [facility]. Pt stated he voluntarily went there after living with his cousin when his father died because the voices were telling him to kill his cousin and she was scared of him. Pt stated that is when he found out he had Schizophrenia. Pt is on disability. Pt stated current stressors are not having family and getting into arguments with peers at the group home. Pt has outpatient med management and ACT services through [facility]. Pt pleasant, cooperative, disheveled, obese, oriented x 4, had good eye  contact, logical/coherent thought processes, and normal speech.          Edwin Henderson was admitted to the adult 500 unit under the care of Dr. Elna Breslow. He was evaluated and his symptoms were identified. Medication management was discussed and initiated. On admission the patient reported that his voices were severe and not responding to Jordan. He was noted to have multiple wounds over his body from responding to the voices. The patient was cross tapered from Latuda to Abilify to target his symptoms. The medication Zyprexa had been started but was suspected to have raised his Prolactin levels. Abilify was determined to be a better choice for his psychosis and elevated Prolactin level. He was oriented to the unit and encouraged to participate in unit programming. Medical problems were identified and treated appropriately. The patient was restarted on numerous medication for chronic medical problems. He received Polymixin for wound care to his scratches and started on Levaquin 500 mg daily to empirically prevent infection.  Home medication was restarted as needed. Patient with multiple medical problems, is wheel chair bound due to degenerative arthritis . Patient also needed assistance walking to the bathroom ,  using a shower as well as feeding self at times. Patient reported that his GH has given him 30 day notice and he has to go to a higher level nursing home.        The patient was evaluated each day by a clinical provider to ascertain the patient's response to treatment.  Improvement was noted by the patient's report of decreasing symptoms, improved sleep and appetite, affect, medication tolerance, behavior, and participation in unit programming.  He was asked each day to complete a self inventory noting mood, mental status, pain, new symptoms, anxiety and concerns.         He responded well to medication and being in a therapeutic and supportive environment. Positive and appropriate behavior was noted and  the patient was motivated for recovery. Edwin Henderson continued to report experiencing auditory hallucinations to dig his nails into his skin. It appeared that the patient slowly responded to his treatment due to his chronic mental illness of schizophrenia.  The patient worked closely with the treatment team and case manager to develop a discharge plan with appropriate goals. Coping skills, problem solving as well as relaxation therapies were also part of the unit programming.         By the day of discharge he was in much improved condition than upon admission.  Symptoms were reported as significantly decreased or resolved completely. The patient denied SI/HI and voiced no AVH. He was motivated to continue taking medication with a goal of continued improvement in mental health.  Edwin Henderson was discharged home with a plan to follow up as noted below.  Consults:  psychiatry  Significant Diagnostic Studies:    Discharge Vitals:   Blood pressure 106/51, pulse 72, temperature 97.8 F (36.6 C), temperature source Oral, resp. rate 18, height  (1.651 m), weight 154.223 kg (340 lb), SpO2 93 %. Body mass index is 56.58 kg/(m^2). Lab Results:   No results found for this or any previous visit (from the past 72 hour(s)).  Physical Findings: AIMS: Facial and Oral Movements Muscles of Facial Expression: None, normal Lips and Perioral Area: None, normal Jaw: None, normal Tongue: None, normal,Extremity Movements Upper (arms, wrists, hands, fingers): None, normal Lower (legs, knees, ankles, toes): None, normal, Trunk Movements Neck, shoulders, hips: None, normal, Overall Severity Severity of abnormal movements (highest score from questions above): None, normal Incapacitation due to abnormal movements: None, normal Patient's awareness of abnormal movements (rate only patient's report): No Awareness, Dental Status Current problems with teeth and/or dentures?: No Does patient usually wear dentures?: No   CIWA:    COWS:      See Psychiatric Specialty Exam and Suicide Risk Assessment completed by Attending Physician prior to discharge.  Discharge destination:  Other:  Group home  Is patient on multiple antipsychotic therapies at discharge:  No   Has Patient had three or more failed trials of antipsychotic monotherapy by history:  No  Recommended Plan for Multiple Antipsychotic Therapies: NA     Medication List    STOP taking these medications        aspirin EC 81 MG tablet     benzonatate 100 MG capsule  Commonly known as:  TESSALON     clobetasol 0.05 % Gel  Commonly known as:  TEMOVATE     colestipol 1 G tablet  Commonly known as:  COLESTID     colestipol 5 G granules  Commonly known as:  COLESTID     cyclobenzaprine 10 MG tablet  Commonly known  as:  FLEXERIL     fluocinonide cream 0.05 %  Commonly known as:  LIDEX     HYDROcodone-acetaminophen 5-325 MG per tablet  Commonly known as:  NORCO/VICODIN     ibuprofen 800 MG tablet  Commonly known as:  ADVIL,MOTRIN     ipratropium-albuterol 0.5-2.5 (3) MG/3ML Soln  Commonly known as:  DUONEB     LATUDA 120 MG Tabs  Generic drug:  Lurasidone HCl     Lurasidone HCl 120 MG Tabs     mesalamine 1.2 G EC tablet  Commonly known as:  LIALDA     naproxen 375 MG tablet  Commonly known as:  NAPROSYN     predniSONE 1 MG tablet  Commonly known as:  DELTASONE     promethazine 25 MG tablet  Commonly known as:  PHENERGAN     QVAR 80 MCG/ACT inhaler  Generic drug:  beclomethasone     SENEXON-S 8.6-50 MG per tablet  Generic drug:  senna-docusate     senna 8.6 MG tablet  Commonly known as:  SENOKOT     SYSTANE BALANCE 0.6 % Soln  Generic drug:  Propylene Glycol     traMADol 50 MG tablet  Commonly known as:  ULTRAM      TAKE these medications      Indication   albuterol 108 (90 BASE) MCG/ACT inhaler  Commonly known as:  PROVENTIL HFA;VENTOLIN HFA  Inhale 1 puff into the lungs every 4 (four) hours as  needed for wheezing or shortness of breath (and coughing).   Indication:  Asthma     amLODipine 5 MG tablet  Commonly known as:  NORVASC  Take 1 tablet (5 mg total) by mouth daily with breakfast. For high blood pressure   Indication:  High Blood Pressure     ARIPiprazole 15 MG tablet  Commonly known as:  ABILIFY  Take 1 tablet (15 mg total) by mouth at bedtime. For mood control   Indication:  Mood control     balsalazide 750 MG capsule  Commonly known as:  COLAZAL  Take 3 capsules (2,250 mg total) by mouth 2 (two) times daily. For colitis   Indication:  Ulcerated Colon     calcium acetate 667 MG capsule  Commonly known as:  PHOSLO  Take 2 capsules (1,334 mg total) by mouth daily with breakfast.   Indication:  Hyperphosphatemia D/T Renal Insufficiency     carbamazepine 200 MG tablet  Commonly known as:  TEGRETOL  Take 2 tablets (400 mg total) by mouth at bedtime. For mood stabilization   Indication:  Mood stability     docusate sodium 100 MG capsule  Commonly known as:  COLACE  Take 1 capsule (100 mg total) by mouth 2 (two) times daily. (Buy OTC): For constipation   Indication:  Constipation     FLUoxetine 40 MG capsule  Commonly known as:  PROZAC  Take 2 capsules (80 mg total) by mouth daily. For depression   Indication:  Major Depressive Disorder     fluticasone 110 MCG/ACT inhaler  Commonly known as:  FLOVENT HFA  Inhale 2 puffs into the lungs 2 (two) times daily. For shortness of breath   Indication:  Asthma, Chronic Obstructive Lung Disease     fluticasone 50 MCG/ACT nasal spray  Commonly known as:  FLONASE  Place 1 spray into both nostrils daily. For allergies   Indication:  Perennial Rhinitis, Hayfever     gabapentin 300 MG capsule  Commonly known as:  NEURONTIN  Take  1 capsule (300 mg) twice daily & 2 capsules (600 mg) a bedtime : For agitation   Indication:  Agitation     lisinopril 10 MG tablet  Commonly known as:  PRINIVIL,ZESTRIL  Take 0.5 tablets (5  mg total) by mouth daily with breakfast. For high blood pressure   Indication:  High Blood Pressure     methylPREDNISolone 4 MG Tbpk tablet  Commonly known as:  MEDROL DOSEPAK  follow package directions: Inflammation   Indication:  Inflammation     montelukast 10 MG tablet  Commonly known as:  SINGULAIR  Take 1 tablet (10 mg total) by mouth daily with breakfast. For COPD   Indication:  Asthma     nystatin cream  Commonly known as:  MYCOSTATIN  Apply topically 2 (two) times daily. Fungal rash   Indication:  Itching     OLANZapine zydis 5 MG disintegrating tablet  Commonly known as:  ZYPREXA  Take 1 tablet (5 mg total) by mouth 3 (three) times daily as needed (for severe anxiety/agitation/psychosis).   Indication:  Psychosis     omeprazole 20 MG capsule  Commonly known as:  PRILOSEC  Take 1 capsule (20 mg total) by mouth daily with breakfast. For acid reflux   Indication:  Gastroesophageal Reflux Disease     oxyCODONE-acetaminophen 5-325 MG per tablet  Commonly known as:  PERCOCET/ROXICET  Take 1 tablet by mouth every 6 (six) hours as needed (for pain).   Indication:  Pain     polymixin-bacitracin 500-10000 UNIT/GM Oint ointment  Apply 1 application topically 2 (two) times daily. For infection   Indication:  Infection     polyvinyl alcohol 1.4 % ophthalmic solution  Commonly known as:  LIQUIFILM TEARS  Place 1 drop into both eyes 4 (four) times daily. For dry eyes   Indication:  Dry eyes     psyllium 0.52 G capsule  Commonly known as:  REGULOID  Take 1 capsule (0.52 g total) by mouth daily with breakfast. For constipation   Indication:  constipation     tamsulosin 0.4 MG Caps capsule  Commonly known as:  FLOMAX  Take 1 capsule (0.4 mg total) by mouth at bedtime. For BPH   Indication:  Enlarged Prostate     topiramate 100 MG tablet  Commonly known as:  TOPAMAX  Take 1 tablet (100 mg total) by mouth 3 (three) times daily. For mood control   Indication:  Mood  stability     traZODone 150 MG tablet  Commonly known as:  DESYREL  Take 1 tablet (150 mg total) by mouth at bedtime. For sleep   Indication:  Trouble Sleeping     Vitamin D (Ergocalciferol) 50000 UNITS Caps capsule  Commonly known as:  DRISDOL  Take 1 capsule (50,000 Units total) by mouth every Sunday. For bone health   Indication:  Osteoporosis       Follow-up Information    Follow up with [facility]    . Schedule an appointment as soon as possible for a visit in 3 days.   Why:  for close follow up of your chest pain   Contact information:   [address] [place] [address] [phone]      Follow up with [facility].   Why:  Someone from the ACT team will be out to see you tomorrow.  Call them to find out what time.   Contact information:   [address]  [place]  [phone]  Follow-up recommendations:   Activity: as tolerated Diet: hart healthy Follow up [facility] as above  Comments:   Take all your medications as prescribed by your mental healthcare provider.  Report any adverse effects and or reactions from your medicines to your outpatient provider promptly.  Patient is instructed and cautioned to not engage in alcohol and or illegal drug use while on prescription medicines.  In the event of worsening symptoms, patient is instructed to call the crisis hotline, 911 and or go to the nearest ED for appropriate evaluation and treatment of symptoms.  Follow-up with your primary care provider for your other medical issues, concerns and or health care needs.   Total Discharge Time: Greater than 30 minutes  Signed: Fransisca Kaufmann, NP-C 08/13/2015, 6:53 PM  I personally assessed the patient and formulated the plan Madie [place] A. Dub Mikes, M.D.

## 2015-12-14 ENCOUNTER — Ambulatory Visit (HOSPITAL_COMMUNITY)
Admission: RE | Admit: 2015-12-14 | Discharge: 2015-12-14 | Disposition: A | Discharge status: Home / Self Care | Payer: Medicare Other | Attending: Psychiatry | Admitting: Psychiatry
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
## 2016-01-04 ENCOUNTER — Emergency Department (HOSPITAL_COMMUNITY)
Admission: EM | Admit: 2016-01-04 | Discharge: 2016-01-04 | Disposition: A | Discharge status: Home / Self Care | Payer: Medicare Other | Attending: Emergency Medicine | Admitting: Emergency Medicine

## 2016-01-04 ENCOUNTER — Encounter (HOSPITAL_COMMUNITY): Payer: Self-pay

## 2016-01-04 ENCOUNTER — Emergency Department (HOSPITAL_COMMUNITY): Payer: Medicare Other

## 2016-01-04 DIAGNOSIS — Z7951 Long term (current) use of inhaled steroids: Secondary | ICD-10-CM | POA: Diagnosis not present

## 2016-01-04 DIAGNOSIS — F209 Schizophrenia, unspecified: Secondary | ICD-10-CM | POA: Insufficient documentation

## 2016-01-04 DIAGNOSIS — Z7982 Long term (current) use of aspirin: Secondary | ICD-10-CM | POA: Insufficient documentation

## 2016-01-04 DIAGNOSIS — Z8669 Personal history of other diseases of the nervous system and sense organs: Secondary | ICD-10-CM | POA: Diagnosis not present

## 2016-01-04 DIAGNOSIS — J449 Chronic obstructive pulmonary disease, unspecified: Secondary | ICD-10-CM | POA: Diagnosis not present

## 2016-01-04 DIAGNOSIS — E669 Obesity, unspecified: Secondary | ICD-10-CM | POA: Insufficient documentation

## 2016-01-04 DIAGNOSIS — F319 Bipolar disorder, unspecified: Secondary | ICD-10-CM | POA: Diagnosis not present

## 2016-01-04 DIAGNOSIS — R110 Nausea: Secondary | ICD-10-CM | POA: Diagnosis not present

## 2016-01-04 DIAGNOSIS — M1990 Unspecified osteoarthritis, unspecified site: Secondary | ICD-10-CM | POA: Diagnosis not present

## 2016-01-04 DIAGNOSIS — Z79899 Other long term (current) drug therapy: Secondary | ICD-10-CM | POA: Diagnosis not present

## 2016-01-04 DIAGNOSIS — R0789 Other chest pain: Secondary | ICD-10-CM

## 2016-01-04 DIAGNOSIS — R079 Chest pain, unspecified: Secondary | ICD-10-CM | POA: Diagnosis present

## 2016-01-04 DIAGNOSIS — Z880 Allergy status to penicillin: Secondary | ICD-10-CM | POA: Insufficient documentation

## 2016-01-04 LAB — BASIC METABOLIC PANEL
ANION GAP: 8 (ref 5–15)
BUN: 12 mg/dL (ref 6–20)
CO2: 25 mmol/L (ref 22–32)
Calcium: 8.8 mg/dL — ABNORMAL LOW (ref 8.9–10.3)
Chloride: 106 mmol/L (ref 101–111)
Creatinine, Ser: 0.87 mg/dL (ref 0.61–1.24)
Glucose, Bld: 76 mg/dL (ref 65–99)
POTASSIUM: 4.3 mmol/L (ref 3.5–5.1)
SODIUM: 139 mmol/L (ref 135–145)

## 2016-01-04 LAB — CBC
HEMATOCRIT: 43 % (ref 39.0–52.0)
Hemoglobin: 13.6 g/dL (ref 13.0–17.0)
MCH: 29 pg (ref 26.0–34.0)
MCHC: 31.6 g/dL (ref 30.0–36.0)
MCV: 91.7 fL (ref 78.0–100.0)
Platelets: 248 10*3/uL (ref 150–400)
RBC: 4.69 MIL/uL (ref 4.22–5.81)
RDW: 14.3 % (ref 11.5–15.5)
WBC: 10.6 10*3/uL — AB (ref 4.0–10.5)

## 2016-01-04 LAB — I-STAT TROPONIN, ED
TROPONIN I, POC: 0 ng/mL (ref 0.00–0.08)
Troponin i, poc: 0 ng/mL (ref 0.00–0.08)

## 2016-01-04 MED ORDER — KETOROLAC TROMETHAMINE 15 MG/ML IJ SOLN
15.0000 mg | Freq: Once | INTRAMUSCULAR | Status: AC
  Administered 2016-01-04: 15 mg via INTRAVENOUS
  Filled 2016-01-04: qty 1

## 2016-01-04 NOTE — Discharge Instructions (Signed)
Chest Wall Pain °Chest wall pain is pain in or around the bones and muscles of your chest. Sometimes, an injury causes this pain. Sometimes, the cause may not be known. This pain may take several weeks or longer to get better. °HOME CARE °Pay attention to any changes in your symptoms. Take these actions to help with your pain: °· Rest as told by your doctor. °· Avoid activities that cause pain. Try not to use your chest, belly (abdominal), or side muscles to lift heavy things. °· If directed, apply ice to the painful area: °¨ Put ice in a plastic bag. °¨ Place a towel between your skin and the bag. °¨ Leave the ice on for 20 minutes, 2-3 times per day. °· Take over-the-counter and prescription medicines only as told by your doctor. °· Do not use tobacco products, including cigarettes, chewing tobacco, and e-cigarettes. If you need help quitting, ask your doctor. °· Keep all follow-up visits as told by your doctor. This is important. °GET HELP IF: °· You have a fever. °· Your chest pain gets worse. °· You have new symptoms. °GET HELP RIGHT AWAY IF: °· You feel sick to your stomach (nauseous) or you throw up (vomit). °· You feel sweaty or light-headed. °· You have a cough with phlegm (sputum) or you cough up blood. °· You are short of breath. °  °This information is not intended to replace advice given to you by your health care provider. Make sure you discuss any questions you have with your health care provider. °  °Document Released: 04/28/2008 Document Revised: 08/01/2015 Document Reviewed: 02/05/2015 °Elsevier Interactive Patient Education ©2016 [employer] ° °

## 2016-01-04 NOTE — ED Notes (Signed)
MD at bedside. 

## 2016-01-04 NOTE — ED Notes (Signed)
Lt. Side chest pain began today, pt. Reports that it radiates into his lt. Arm with nausea and sob.  Skin is warm and dry, pink.  Pt. Is alert and oriented x 4.

## 2016-01-04 NOTE — ED Provider Notes (Signed)
CSN: [id number]     Arrival date & time 01/04/16  1422 History   First MD Initiated Contact with Patient 01/04/16 1515     Chief Complaint  Patient presents with  . Chest Pain     (Consider location/radiation/quality/duration/timing/severity/associated sxs/prior Treatment) Patient is a 55 y.o. male presenting with chest pain.  Chest Pain Pain location:  L chest Pain quality: sharp   Pain radiates to:  L arm Pain severity:  Severe Onset quality:  Gradual Duration:  3 hours Timing:  Constant Progression:  Worsening Chronicity:  New Context: at rest   Relieved by:  Nothing Worsened by:  Nothing tried Ineffective treatments:  Aspirin Associated symptoms: nausea   Associated symptoms: no abdominal pain, no back pain, no claudication, no cough, no dizziness, no fatigue, no fever, no headache, no palpitations, no shortness of breath, no syncope, not vomiting and no weakness   Risk factors: hypertension, male sex, obesity and prior DVT/PE (LUE; several yrs ago.)   Risk factors: no aortic disease, no diabetes mellitus, no high cholesterol and no smoking     Past Medical History  Diagnosis Date  . Sleep apnea   . Schizophrenia (HCC)   . Bipolar disorder (HCC)   . Depression   . COPD (chronic obstructive pulmonary disease) (HCC)   . Seizures (HCC)   . Arthritis    No past surgical history on file. Family History  Problem Relation Age of Onset  . Diabetes Mother   . Mental illness Neg Hx   . Alcoholism Neg Hx    Social History  Substance Use Topics  . Smoking status: Never Smoker   . Smokeless tobacco: None  . Alcohol Use: No    Review of Systems  Constitutional: Negative for fever, chills, appetite change and fatigue.  HENT: Negative for congestion, ear pain, facial swelling, mouth sores and sore throat.   Eyes: Negative for visual disturbance.  Respiratory: Negative for cough, chest tightness and shortness of breath.   Cardiovascular: Positive for chest pain.  Negative for palpitations, claudication and syncope.  Gastrointestinal: Positive for nausea. Negative for vomiting, abdominal pain, diarrhea and blood in stool.  Endocrine: Negative for cold intolerance and heat intolerance.  Genitourinary: Negative for frequency, decreased urine volume and difficulty urinating.  Musculoskeletal: Negative for back pain and neck stiffness.  Skin: Negative for rash.  Neurological: Negative for dizziness, weakness, light-headedness and headaches.  All other systems reviewed and are negative.     Allergies  Bee venom; Penicillins; A & d; Shellfish allergy; Tetracyclines & related; Codeine; and Tetracycline  Home Medications   Prior to Admission medications   Medication Sig Start Date End Date Taking? Authorizing Provider  amLODipine (NORVASC) 5 MG tablet Take 1 tablet (5 mg total) by mouth daily with breakfast. For high blood pressure 08/13/15  Yes Sanjuana Kava, NP  aspirin EC 81 MG tablet Take 81 mg by mouth daily.   Yes Historical Provider, MD  balsalazide (COLAZAL) 750 MG capsule Take 3 capsules (2,250 mg total) by mouth 2 (two) times daily. For colitis 08/13/15  Yes Sanjuana Kava, NP  calcium acetate (PHOSLO) 667 MG capsule Take 2 capsules (1,334 mg total) by mouth daily with breakfast. 08/13/15  Yes Sanjuana Kava, NP  FLUoxetine (PROZAC) 40 MG capsule Take 2 capsules (80 mg total) by mouth daily. For depression Patient taking differently: Take 40 mg by mouth daily. For depression 08/13/15  Yes Sanjuana Kava, NP  fluticasone (FLONASE) 50 MCG/ACT nasal spray Place 1  spray into both nostrils daily. For allergies 08/13/15  Yes Sanjuana Kava, NP  fluticasone (FLOVENT HFA) 110 MCG/ACT inhaler Inhale 2 puffs into the lungs 2 (two) times daily. For shortness of breath 08/13/15  Yes Sanjuana Kava, NP  gabapentin (NEURONTIN) 300 MG capsule Take 1 capsule (300 mg) twice daily & 2 capsules (600 mg) a bedtime : For agitation Patient taking differently: Take 300-600 mg by  mouth 3 (three) times daily. 300 mg twice a day (in the morning and at noon) and 600 mg at bedtime 08/13/15  Yes Sanjuana Kava, NP  lisinopril (PRINIVIL,ZESTRIL) 10 MG tablet Take 0.5 tablets (5 mg total) by mouth daily with breakfast. For high blood pressure 08/13/15  Yes Sanjuana Kava, NP  montelukast (SINGULAIR) 10 MG tablet Take 1 tablet (10 mg total) by mouth daily with breakfast. For COPD 08/13/15  Yes Sanjuana Kava, NP  OLANZapine zydis (ZYPREXA) 5 MG disintegrating tablet Take 1 tablet (5 mg total) by mouth 3 (three) times daily as needed (for severe anxiety/agitation/psychosis). 08/13/15  Yes Sanjuana Kava, NP  omeprazole (PRILOSEC) 20 MG capsule Take 1 capsule (20 mg total) by mouth daily with breakfast. For acid reflux 08/13/15  Yes Sanjuana Kava, NP  perphenazine (TRILAFON) 4 MG tablet Take 4-8 mg by mouth 2 (two) times daily. 4 mg in the morning and 8 mg every evening   Yes Historical Provider, MD  Propylene Glycol (SYSTANE BALANCE OP) Place 1 drop into both eyes 4 (four) times daily.   Yes Historical Provider, MD  psyllium (REGULOID) 0.52 G capsule Take 1 capsule (0.52 g total) by mouth daily with breakfast. For constipation 08/13/15  Yes Sanjuana Kava, NP  tamsulosin (FLOMAX) 0.4 MG CAPS capsule Take 1 capsule (0.4 mg total) by mouth at bedtime. For BPH 08/13/15  Yes Sanjuana Kava, NP  topiramate (TOPAMAX) 100 MG tablet Take 1 tablet (100 mg total) by mouth 3 (three) times daily. For mood control 08/13/15  Yes Sanjuana Kava, NP  traZODone (DESYREL) 150 MG tablet Take 1 tablet (150 mg total) by mouth at bedtime. For sleep Patient taking differently: Take 300 mg by mouth at bedtime. For sleep 08/13/15  Yes Sanjuana Kava, NP  Vitamin D, Ergocalciferol, (DRISDOL) 50000 UNITS CAPS capsule Take 1 capsule (50,000 Units total) by mouth every Sunday. For bone health Patient taking differently: Take 50,000 Units by mouth every Saturday. For bone health 08/13/15  Yes Sanjuana Kava, NP  albuterol (PROVENTIL  HFA;VENTOLIN HFA) 108 (90 BASE) MCG/ACT inhaler Inhale 1 puff into the lungs every 4 (four) hours as needed for wheezing or shortness of breath (and coughing). 08/13/15   Sanjuana Kava, NP  ARIPiprazole (ABILIFY) 15 MG tablet Take 1 tablet (15 mg total) by mouth at bedtime. For mood control Patient not taking: Reported on 01/04/2016 08/13/15   Sanjuana Kava, NP  carbamazepine (TEGRETOL) 200 MG tablet Take 2 tablets (400 mg total) by mouth at bedtime. For mood stabilization Patient not taking: Reported on 01/04/2016 08/13/15   Sanjuana Kava, NP  docusate sodium (COLACE) 100 MG capsule Take 1 capsule (100 mg total) by mouth 2 (two) times daily. (Buy OTC): For constipation Patient not taking: Reported on 01/04/2016 08/13/15   Sanjuana Kava, NP  methylPREDNISolone (MEDROL DOSEPAK) 4 MG TBPK tablet follow package directions: Inflammation Patient not taking: Reported on 01/04/2016 08/13/15   Sanjuana Kava, NP  nystatin cream (MYCOSTATIN) Apply topically 2 (two) times daily. Fungal  rash 08/13/15   Sanjuana Kava, NP  oxyCODONE-acetaminophen (PERCOCET/ROXICET) 5-325 MG per tablet Take 1 tablet by mouth every 6 (six) hours as needed (for pain). 08/13/15   Sanjuana Kava, NP  polymixin-bacitracin (POLYSPORIN) 500-10000 UNIT/GM OINT ointment Apply 1 application topically 2 (two) times daily. For infection 08/13/15   Sanjuana Kava, NP  polyvinyl alcohol (LIQUIFILM TEARS) 1.4 % ophthalmic solution Place 1 drop into both eyes 4 (four) times daily. For dry eyes Patient not taking: Reported on 01/04/2016 08/13/15   Sanjuana Kava, NP   BP 112/62 mmHg  Pulse 60  Temp(Src) 98.1 F (36.7 C) (Oral)  Resp 18  Ht 5\' 5"  (1.651 m)  Wt 152.862 kg  BMI 56.08 kg/m2  SpO2 96% Physical Exam  Constitutional: He is oriented to person, place, and time. He appears well-nourished. No distress.  obese  HENT:  Head: Normocephalic and atraumatic.  Right Ear: External ear normal.  Left Ear: External ear normal.  Eyes: Pupils are equal,  round, and reactive to light. Right eye exhibits no discharge. Left eye exhibits no discharge. No scleral icterus.  Neck: Normal range of motion. Neck supple.  Cardiovascular: Normal rate.  Exam reveals no gallop and no friction rub.   No murmur heard. Pulmonary/Chest: Effort normal and breath sounds normal. No stridor. No respiratory distress. He has no wheezes. He has no rales. He exhibits no tenderness.    Abdominal: Soft. He exhibits no distension and no mass. There is no tenderness. There is no rebound and no guarding.  Musculoskeletal: He exhibits no edema or tenderness.  Neurological: He is alert and oriented to person, place, and time.  Skin: Skin is warm and dry. No rash noted. He is not diaphoretic. No erythema.    ED Course  Procedures (including critical care time) Labs Review Labs Reviewed  BASIC METABOLIC PANEL - Abnormal; Notable for the following:    Calcium 8.8 (*)    All other components within normal limits  CBC - Abnormal; Notable for the following:    WBC 10.6 (*)    All other components within normal limits  I-STAT TROPOININ, ED  Rosezena Sensor, ED    Imaging Review Dg Chest 2 View  01/04/2016  CLINICAL DATA:  Chest pain for 1 day. EXAM: CHEST  2 VIEW COMPARISON:  08/08/2015 FINDINGS: Mild enlargement of the cardiopericardial silhouette, without edema. Body habitus reduces diagnostic sensitivity and specificity. Posterior portion of the chest is obscured on the lateral projection, believed to likely be due to the soft tissues of the chest wall. Indistinctness of right infrahilar tissues but this appears to be chronically stable and is probably due to underlying vasculature. IMPRESSION: 1. Mild enlargement of the cardiopericardial silhouette, without edema. 2. Limited lateral projection with the posterior portions of the lung somewhat obscured by the soft tissues of the chest wall. Electronically Signed   By: Gaylyn Rong M.D.   On: 01/04/2016 15:12   I  have personally reviewed and evaluated these images and lab results as part of my medical decision-making.   EKG Interpretation   Date/Time:  Friday January 04 2016 14:28:09 EST Ventricular Rate:  59 PR Interval:  157 QRS Duration: 144 QT Interval:  438 QTC Calculation: 434 R Axis:   89 Text Interpretation:  Sinus rhythm Right bundle branch block Probable  anterolateral infarct, old No significant change since last tracing  Confirmed by Anitra Lauth  MD, Alphonzo Lemmings ([id number]) on 01/04/2016 2:39:41 PM      MDM  55 year old male with a history of hypertension obesity presents to the ED with 3 hours of left-sided chest pain that radiates to the left arm. Patient describes the chest pain is sharp and constant. Rest of the history as above.  On arrival patient is afebrile, with stable vital signs. Respiratory exam as above which is notable for completely reproducible left pectoral and left shoulder girdle muscle tenderness.  EKG with normal sinus rhythm. Right axis deviation. Right bundle branch block. Nonspecific findings. No acute ischemia or arrhythmia or blocks. Similar to prior EKGs. Presentation is most consistent with muscular skeletal pain. Patient is a HEAR score of 3.  Chest x-ray without evidence of pneumonia, pneumothorax, pneumomediastinum, pulmonary edema, pleural effusions. No abnormal contour to the mediastinum.  Delta troponins negative. Low suspicion for pulmonary embolism. Presentation is classic for dissection.  Patient safely discharged with strict return precautions. Patient to follow-up with PCP as needed.  Final diagnoses:  Chest wall pain   Seen in conjunction with Dr. Jodelle Gross.  Drema Pry, MD,  Resident  Drema Pry, MD 01/05/16 [id number]  Vanetta Mulders, MD 01/07/16 [phone]

## 2017-09-18 IMAGING — DX DG CHEST 2V
2 series · 2 of 2 positions shown · non-contrast
Comparison: 08/08/2015

CLINICAL DATA: Chest pain for 1 day.

EXAM:
CHEST  2 VIEW

[chest lat]
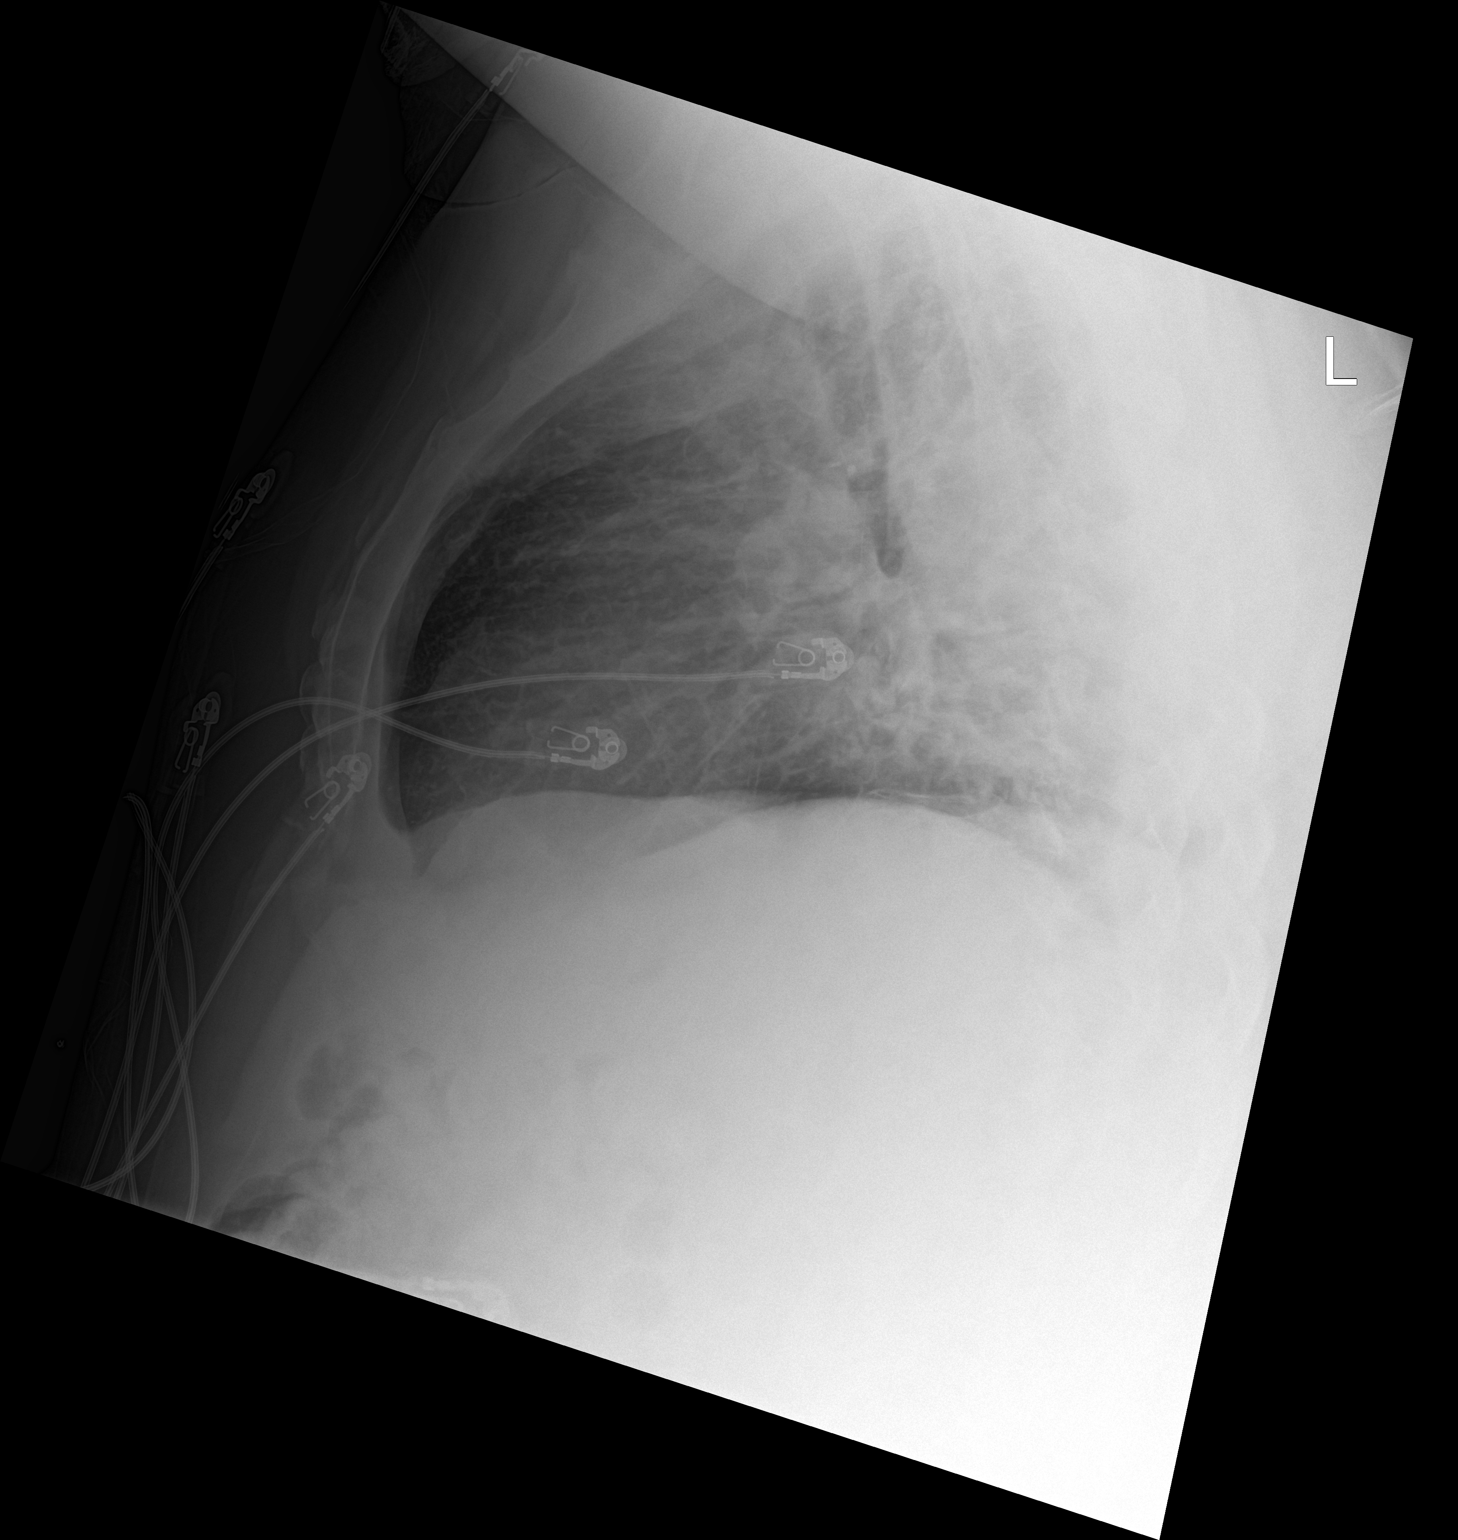

[chest ap]
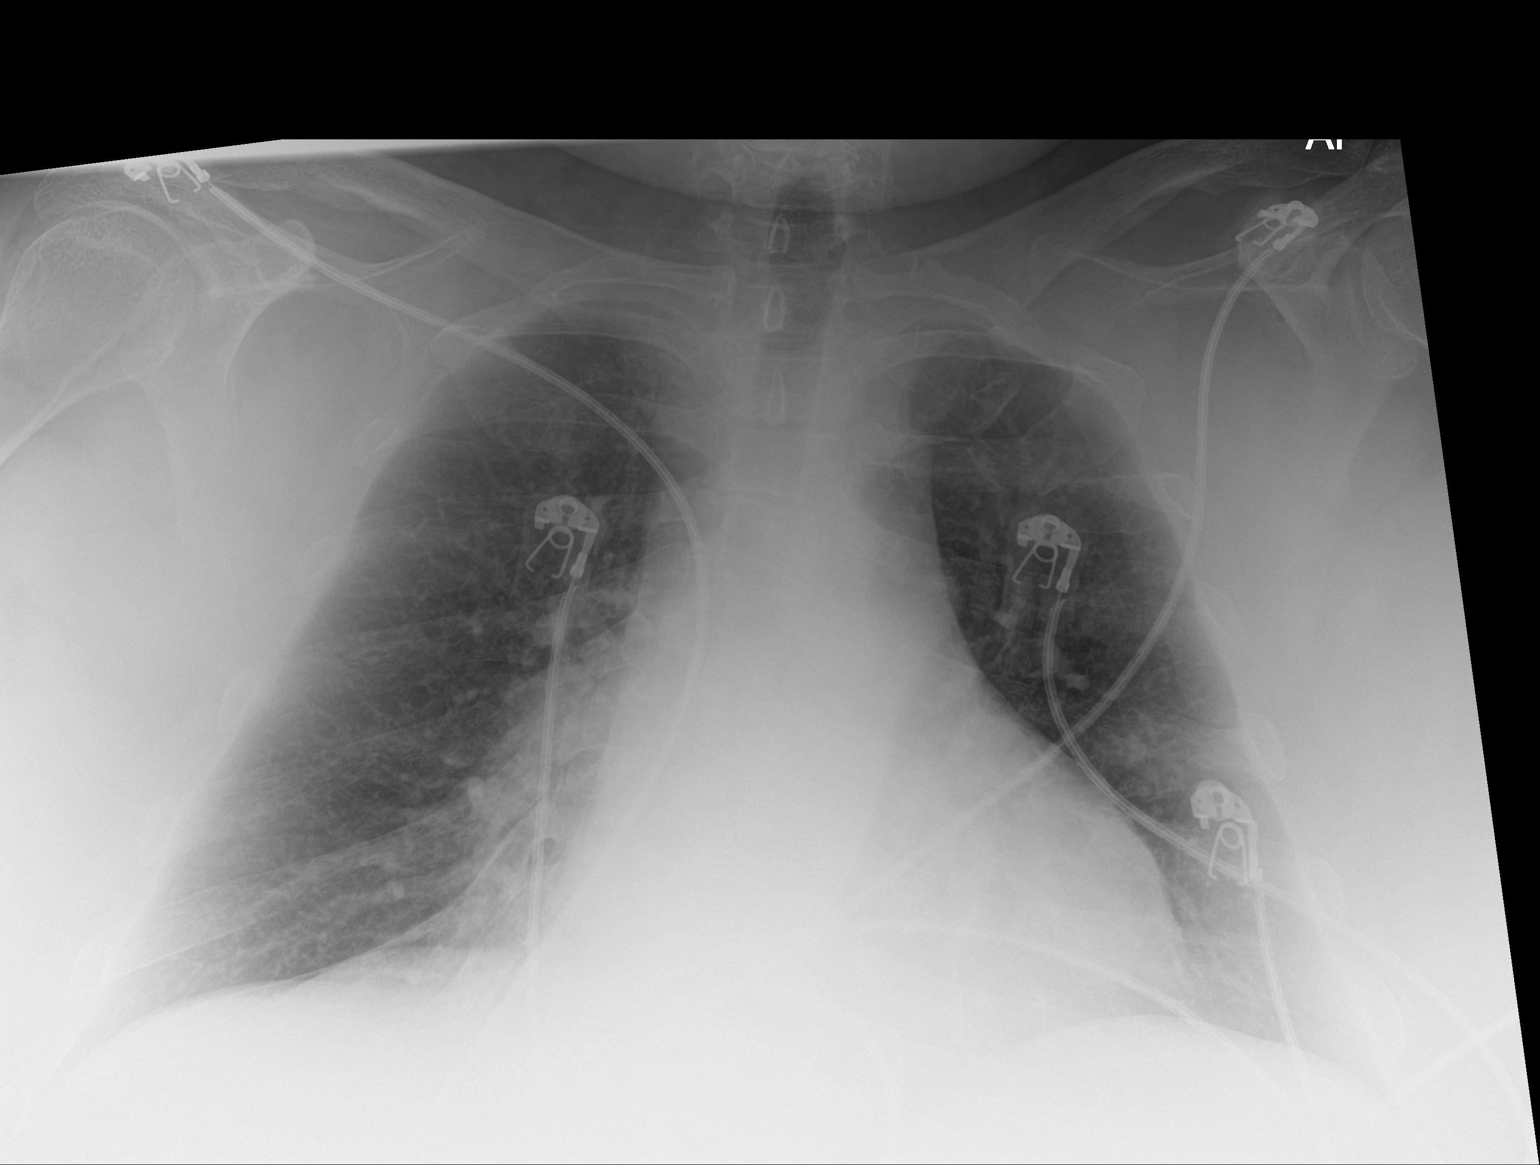

[2 of 2 positions shown; findings below may reference images not displayed]

FINDINGS: Mild enlargement of the cardiopericardial silhouette, without edema.
Body habitus reduces diagnostic sensitivity and specificity.
Posterior portion of the chest is obscured on the lateral
projection, believed to likely be due to the soft tissues of the
chest wall. Indistinctness of right infrahilar tissues but this
appears to be chronically stable and is probably due to underlying
vasculature.
IMPRESSION: 1. Mild enlargement of the cardiopericardial silhouette, without
edema.
2. Limited lateral projection with the posterior portions of the
lung somewhat obscured by the soft tissues of the chest wall.

## 2018-10-24 DEATH — deceased
